# Patient Record
Sex: Male | Born: 1945
Health system: Southern US, Community
[De-identification: ages and names within clinical notes are randomized; demographics above are authoritative.]

## PROBLEM LIST (undated history)

## (undated) DIAGNOSIS — I10 Essential (primary) hypertension: Secondary | ICD-10-CM

## (undated) DIAGNOSIS — R058 Adverse effect of angiotensin-converting-enzyme inhibitors, initial encounter: Secondary | ICD-10-CM

## (undated) DIAGNOSIS — Z87442 Personal history of urinary calculi: Secondary | ICD-10-CM

## (undated) DIAGNOSIS — J189 Pneumonia, unspecified organism: Secondary | ICD-10-CM

## (undated) DIAGNOSIS — T464X5A Adverse effect of angiotensin-converting-enzyme inhibitors, initial encounter: Secondary | ICD-10-CM

## (undated) DIAGNOSIS — Z9889 Other specified postprocedural states: Secondary | ICD-10-CM

## (undated) DIAGNOSIS — R112 Nausea with vomiting, unspecified: Secondary | ICD-10-CM

## (undated) DIAGNOSIS — M1990 Unspecified osteoarthritis, unspecified site: Secondary | ICD-10-CM

## (undated) DIAGNOSIS — Z8489 Family history of other specified conditions: Secondary | ICD-10-CM

## (undated) DIAGNOSIS — R519 Headache, unspecified: Secondary | ICD-10-CM

## (undated) DIAGNOSIS — R51 Headache: Secondary | ICD-10-CM

## (undated) DIAGNOSIS — E119 Type 2 diabetes mellitus without complications: Secondary | ICD-10-CM

## (undated) DIAGNOSIS — D649 Anemia, unspecified: Secondary | ICD-10-CM

## (undated) HISTORY — DX: Adverse effect of angiotensin-converting-enzyme inhibitors, initial encounter: R05.8

## (undated) HISTORY — PX: EYE SURGERY: SHX253

## (undated) HISTORY — DX: Adverse effect of angiotensin-converting-enzyme inhibitors, initial encounter: T46.4X5A

---

## 1998-06-30 DIAGNOSIS — Z87442 Personal history of urinary calculi: Secondary | ICD-10-CM

## 1998-06-30 HISTORY — DX: Personal history of urinary calculi: Z87.442

## 2002-06-30 HISTORY — PX: BACK SURGERY: SHX140

## 2007-07-01 HISTORY — PX: CARPAL TUNNEL RELEASE: SHX101

## 2014-09-05 DIAGNOSIS — E119 Type 2 diabetes mellitus without complications: Secondary | ICD-10-CM | POA: Diagnosis not present

## 2014-09-05 DIAGNOSIS — E782 Mixed hyperlipidemia: Secondary | ICD-10-CM | POA: Diagnosis not present

## 2014-11-30 DIAGNOSIS — L821 Other seborrheic keratosis: Secondary | ICD-10-CM | POA: Diagnosis not present

## 2014-11-30 DIAGNOSIS — B079 Viral wart, unspecified: Secondary | ICD-10-CM | POA: Diagnosis not present

## 2014-11-30 DIAGNOSIS — L820 Inflamed seborrheic keratosis: Secondary | ICD-10-CM | POA: Diagnosis not present

## 2014-11-30 DIAGNOSIS — D485 Neoplasm of uncertain behavior of skin: Secondary | ICD-10-CM | POA: Diagnosis not present

## 2014-12-04 DIAGNOSIS — E119 Type 2 diabetes mellitus without complications: Secondary | ICD-10-CM | POA: Diagnosis not present

## 2014-12-04 DIAGNOSIS — I10 Essential (primary) hypertension: Secondary | ICD-10-CM | POA: Diagnosis not present

## 2014-12-04 DIAGNOSIS — E782 Mixed hyperlipidemia: Secondary | ICD-10-CM | POA: Diagnosis not present

## 2015-01-12 DIAGNOSIS — H04129 Dry eye syndrome of unspecified lacrimal gland: Secondary | ICD-10-CM | POA: Diagnosis not present

## 2015-01-12 DIAGNOSIS — E119 Type 2 diabetes mellitus without complications: Secondary | ICD-10-CM | POA: Diagnosis not present

## 2015-01-12 DIAGNOSIS — H2513 Age-related nuclear cataract, bilateral: Secondary | ICD-10-CM | POA: Diagnosis not present

## 2015-02-14 DIAGNOSIS — H2513 Age-related nuclear cataract, bilateral: Secondary | ICD-10-CM | POA: Diagnosis not present

## 2015-02-14 DIAGNOSIS — H04129 Dry eye syndrome of unspecified lacrimal gland: Secondary | ICD-10-CM | POA: Diagnosis not present

## 2015-03-06 DIAGNOSIS — I10 Essential (primary) hypertension: Secondary | ICD-10-CM | POA: Diagnosis not present

## 2015-03-06 DIAGNOSIS — E782 Mixed hyperlipidemia: Secondary | ICD-10-CM | POA: Diagnosis not present

## 2015-03-06 DIAGNOSIS — E119 Type 2 diabetes mellitus without complications: Secondary | ICD-10-CM | POA: Diagnosis not present

## 2015-03-06 DIAGNOSIS — N528 Other male erectile dysfunction: Secondary | ICD-10-CM | POA: Diagnosis not present

## 2015-03-06 DIAGNOSIS — G44009 Cluster headache syndrome, unspecified, not intractable: Secondary | ICD-10-CM | POA: Diagnosis not present

## 2015-10-24 DIAGNOSIS — M170 Bilateral primary osteoarthritis of knee: Secondary | ICD-10-CM | POA: Diagnosis not present

## 2015-10-24 DIAGNOSIS — Z23 Encounter for immunization: Secondary | ICD-10-CM | POA: Diagnosis not present

## 2015-10-24 DIAGNOSIS — L812 Freckles: Secondary | ICD-10-CM | POA: Diagnosis not present

## 2015-10-24 DIAGNOSIS — E782 Mixed hyperlipidemia: Secondary | ICD-10-CM | POA: Diagnosis not present

## 2015-10-24 DIAGNOSIS — E119 Type 2 diabetes mellitus without complications: Secondary | ICD-10-CM | POA: Diagnosis not present

## 2015-10-24 DIAGNOSIS — H04129 Dry eye syndrome of unspecified lacrimal gland: Secondary | ICD-10-CM | POA: Diagnosis not present

## 2016-01-16 DIAGNOSIS — E782 Mixed hyperlipidemia: Secondary | ICD-10-CM | POA: Diagnosis not present

## 2016-01-16 DIAGNOSIS — L812 Freckles: Secondary | ICD-10-CM | POA: Diagnosis not present

## 2016-01-16 DIAGNOSIS — E119 Type 2 diabetes mellitus without complications: Secondary | ICD-10-CM | POA: Diagnosis not present

## 2016-01-16 DIAGNOSIS — H04129 Dry eye syndrome of unspecified lacrimal gland: Secondary | ICD-10-CM | POA: Diagnosis not present

## 2016-01-16 DIAGNOSIS — M170 Bilateral primary osteoarthritis of knee: Secondary | ICD-10-CM | POA: Diagnosis not present

## 2016-01-18 DIAGNOSIS — Z6829 Body mass index (BMI) 29.0-29.9, adult: Secondary | ICD-10-CM | POA: Diagnosis not present

## 2016-01-18 DIAGNOSIS — Z1389 Encounter for screening for other disorder: Secondary | ICD-10-CM | POA: Diagnosis not present

## 2016-01-18 DIAGNOSIS — E782 Mixed hyperlipidemia: Secondary | ICD-10-CM | POA: Diagnosis not present

## 2016-01-18 DIAGNOSIS — N529 Male erectile dysfunction, unspecified: Secondary | ICD-10-CM | POA: Diagnosis not present

## 2016-01-18 DIAGNOSIS — M170 Bilateral primary osteoarthritis of knee: Secondary | ICD-10-CM | POA: Diagnosis not present

## 2016-01-18 DIAGNOSIS — E119 Type 2 diabetes mellitus without complications: Secondary | ICD-10-CM | POA: Diagnosis not present

## 2016-03-06 DIAGNOSIS — R5383 Other fatigue: Secondary | ICD-10-CM | POA: Diagnosis not present

## 2016-03-06 DIAGNOSIS — E119 Type 2 diabetes mellitus without complications: Secondary | ICD-10-CM | POA: Diagnosis not present

## 2016-03-06 DIAGNOSIS — D519 Vitamin B12 deficiency anemia, unspecified: Secondary | ICD-10-CM | POA: Diagnosis not present

## 2016-03-06 DIAGNOSIS — Z6830 Body mass index (BMI) 30.0-30.9, adult: Secondary | ICD-10-CM | POA: Diagnosis not present

## 2016-03-06 DIAGNOSIS — M791 Myalgia: Secondary | ICD-10-CM | POA: Diagnosis not present

## 2016-03-06 DIAGNOSIS — S30861A Insect bite (nonvenomous) of abdominal wall, initial encounter: Secondary | ICD-10-CM | POA: Diagnosis not present

## 2016-06-30 HISTORY — PX: HERNIA REPAIR: SHX51

## 2016-07-21 DIAGNOSIS — E782 Mixed hyperlipidemia: Secondary | ICD-10-CM | POA: Diagnosis not present

## 2016-07-21 DIAGNOSIS — M170 Bilateral primary osteoarthritis of knee: Secondary | ICD-10-CM | POA: Diagnosis not present

## 2016-07-21 DIAGNOSIS — R5383 Other fatigue: Secondary | ICD-10-CM | POA: Diagnosis not present

## 2016-07-21 DIAGNOSIS — N400 Enlarged prostate without lower urinary tract symptoms: Secondary | ICD-10-CM | POA: Diagnosis not present

## 2016-07-21 DIAGNOSIS — E119 Type 2 diabetes mellitus without complications: Secondary | ICD-10-CM | POA: Diagnosis not present

## 2016-07-21 DIAGNOSIS — N529 Male erectile dysfunction, unspecified: Secondary | ICD-10-CM | POA: Diagnosis not present

## 2016-07-24 DIAGNOSIS — M1712 Unilateral primary osteoarthritis, left knee: Secondary | ICD-10-CM | POA: Diagnosis not present

## 2016-07-24 DIAGNOSIS — Z0001 Encounter for general adult medical examination with abnormal findings: Secondary | ICD-10-CM | POA: Diagnosis not present

## 2016-07-24 DIAGNOSIS — N529 Male erectile dysfunction, unspecified: Secondary | ICD-10-CM | POA: Diagnosis not present

## 2016-07-24 DIAGNOSIS — M170 Bilateral primary osteoarthritis of knee: Secondary | ICD-10-CM | POA: Diagnosis not present

## 2016-07-24 DIAGNOSIS — E119 Type 2 diabetes mellitus without complications: Secondary | ICD-10-CM | POA: Diagnosis not present

## 2016-07-24 DIAGNOSIS — E782 Mixed hyperlipidemia: Secondary | ICD-10-CM | POA: Diagnosis not present

## 2016-07-24 DIAGNOSIS — H04129 Dry eye syndrome of unspecified lacrimal gland: Secondary | ICD-10-CM | POA: Diagnosis not present

## 2016-07-24 DIAGNOSIS — M1711 Unilateral primary osteoarthritis, right knee: Secondary | ICD-10-CM | POA: Diagnosis not present

## 2016-10-14 DIAGNOSIS — H2513 Age-related nuclear cataract, bilateral: Secondary | ICD-10-CM | POA: Diagnosis not present

## 2016-10-14 DIAGNOSIS — E119 Type 2 diabetes mellitus without complications: Secondary | ICD-10-CM | POA: Diagnosis not present

## 2016-10-14 DIAGNOSIS — Z9889 Other specified postprocedural states: Secondary | ICD-10-CM | POA: Diagnosis not present

## 2017-01-20 DIAGNOSIS — H04129 Dry eye syndrome of unspecified lacrimal gland: Secondary | ICD-10-CM | POA: Diagnosis not present

## 2017-01-20 DIAGNOSIS — E119 Type 2 diabetes mellitus without complications: Secondary | ICD-10-CM | POA: Diagnosis not present

## 2017-01-20 DIAGNOSIS — E782 Mixed hyperlipidemia: Secondary | ICD-10-CM | POA: Diagnosis not present

## 2017-01-20 DIAGNOSIS — R5383 Other fatigue: Secondary | ICD-10-CM | POA: Diagnosis not present

## 2017-01-22 DIAGNOSIS — M1712 Unilateral primary osteoarthritis, left knee: Secondary | ICD-10-CM | POA: Diagnosis not present

## 2017-01-22 DIAGNOSIS — H8110 Benign paroxysmal vertigo, unspecified ear: Secondary | ICD-10-CM | POA: Diagnosis not present

## 2017-01-22 DIAGNOSIS — K429 Umbilical hernia without obstruction or gangrene: Secondary | ICD-10-CM | POA: Diagnosis not present

## 2017-01-22 DIAGNOSIS — R0609 Other forms of dyspnea: Secondary | ICD-10-CM | POA: Diagnosis not present

## 2017-01-22 DIAGNOSIS — E119 Type 2 diabetes mellitus without complications: Secondary | ICD-10-CM | POA: Diagnosis not present

## 2017-01-22 DIAGNOSIS — Z77098 Contact with and (suspected) exposure to other hazardous, chiefly nonmedicinal, chemicals: Secondary | ICD-10-CM | POA: Diagnosis not present

## 2017-01-22 DIAGNOSIS — R5383 Other fatigue: Secondary | ICD-10-CM | POA: Diagnosis not present

## 2017-01-22 DIAGNOSIS — E782 Mixed hyperlipidemia: Secondary | ICD-10-CM | POA: Diagnosis not present

## 2017-01-30 DIAGNOSIS — R931 Abnormal findings on diagnostic imaging of heart and coronary circulation: Secondary | ICD-10-CM | POA: Diagnosis not present

## 2017-01-30 DIAGNOSIS — R0600 Dyspnea, unspecified: Secondary | ICD-10-CM | POA: Diagnosis not present

## 2017-01-30 DIAGNOSIS — R0609 Other forms of dyspnea: Secondary | ICD-10-CM | POA: Diagnosis not present

## 2017-02-02 DIAGNOSIS — L820 Inflamed seborrheic keratosis: Secondary | ICD-10-CM | POA: Diagnosis not present

## 2017-02-02 DIAGNOSIS — D239 Other benign neoplasm of skin, unspecified: Secondary | ICD-10-CM | POA: Diagnosis not present

## 2017-02-02 DIAGNOSIS — L821 Other seborrheic keratosis: Secondary | ICD-10-CM | POA: Diagnosis not present

## 2017-02-06 DIAGNOSIS — Z809 Family history of malignant neoplasm, unspecified: Secondary | ICD-10-CM | POA: Diagnosis not present

## 2017-02-06 DIAGNOSIS — M1712 Unilateral primary osteoarthritis, left knee: Secondary | ICD-10-CM | POA: Diagnosis not present

## 2017-02-06 DIAGNOSIS — K420 Umbilical hernia with obstruction, without gangrene: Secondary | ICD-10-CM | POA: Diagnosis not present

## 2017-02-06 DIAGNOSIS — Z7984 Long term (current) use of oral hypoglycemic drugs: Secondary | ICD-10-CM | POA: Diagnosis not present

## 2017-02-06 DIAGNOSIS — Z79899 Other long term (current) drug therapy: Secondary | ICD-10-CM | POA: Diagnosis not present

## 2017-02-06 DIAGNOSIS — E119 Type 2 diabetes mellitus without complications: Secondary | ICD-10-CM | POA: Diagnosis not present

## 2017-02-06 DIAGNOSIS — E785 Hyperlipidemia, unspecified: Secondary | ICD-10-CM | POA: Diagnosis not present

## 2017-02-06 DIAGNOSIS — I10 Essential (primary) hypertension: Secondary | ICD-10-CM | POA: Diagnosis not present

## 2017-02-10 DIAGNOSIS — Z79899 Other long term (current) drug therapy: Secondary | ICD-10-CM | POA: Diagnosis not present

## 2017-02-10 DIAGNOSIS — K429 Umbilical hernia without obstruction or gangrene: Secondary | ICD-10-CM | POA: Diagnosis not present

## 2017-02-10 DIAGNOSIS — E119 Type 2 diabetes mellitus without complications: Secondary | ICD-10-CM | POA: Diagnosis not present

## 2017-02-10 DIAGNOSIS — Z7984 Long term (current) use of oral hypoglycemic drugs: Secondary | ICD-10-CM | POA: Diagnosis not present

## 2017-02-10 DIAGNOSIS — K420 Umbilical hernia with obstruction, without gangrene: Secondary | ICD-10-CM | POA: Diagnosis not present

## 2017-02-10 DIAGNOSIS — I10 Essential (primary) hypertension: Secondary | ICD-10-CM | POA: Diagnosis not present

## 2017-02-10 DIAGNOSIS — E785 Hyperlipidemia, unspecified: Secondary | ICD-10-CM | POA: Diagnosis not present

## 2017-02-11 DIAGNOSIS — R05 Cough: Secondary | ICD-10-CM | POA: Diagnosis not present

## 2017-02-11 DIAGNOSIS — E785 Hyperlipidemia, unspecified: Secondary | ICD-10-CM | POA: Diagnosis not present

## 2017-02-11 DIAGNOSIS — I10 Essential (primary) hypertension: Secondary | ICD-10-CM | POA: Diagnosis not present

## 2017-02-11 DIAGNOSIS — Z79899 Other long term (current) drug therapy: Secondary | ICD-10-CM | POA: Diagnosis not present

## 2017-02-11 DIAGNOSIS — R0981 Nasal congestion: Secondary | ICD-10-CM | POA: Diagnosis not present

## 2017-02-11 DIAGNOSIS — R0902 Hypoxemia: Secondary | ICD-10-CM | POA: Diagnosis not present

## 2017-02-11 DIAGNOSIS — E119 Type 2 diabetes mellitus without complications: Secondary | ICD-10-CM | POA: Diagnosis not present

## 2017-02-11 DIAGNOSIS — Z9889 Other specified postprocedural states: Secondary | ICD-10-CM | POA: Diagnosis not present

## 2017-02-11 DIAGNOSIS — Z7984 Long term (current) use of oral hypoglycemic drugs: Secondary | ICD-10-CM | POA: Diagnosis not present

## 2017-02-11 DIAGNOSIS — R0602 Shortness of breath: Secondary | ICD-10-CM | POA: Diagnosis not present

## 2017-02-11 DIAGNOSIS — M1990 Unspecified osteoarthritis, unspecified site: Secondary | ICD-10-CM | POA: Diagnosis not present

## 2017-02-13 DIAGNOSIS — R0602 Shortness of breath: Secondary | ICD-10-CM | POA: Diagnosis not present

## 2017-02-19 DIAGNOSIS — J449 Chronic obstructive pulmonary disease, unspecified: Secondary | ICD-10-CM | POA: Diagnosis not present

## 2017-02-19 DIAGNOSIS — Z77090 Contact with and (suspected) exposure to asbestos: Secondary | ICD-10-CM | POA: Diagnosis not present

## 2017-02-19 DIAGNOSIS — Z6829 Body mass index (BMI) 29.0-29.9, adult: Secondary | ICD-10-CM | POA: Diagnosis not present

## 2017-02-19 DIAGNOSIS — R0902 Hypoxemia: Secondary | ICD-10-CM | POA: Diagnosis not present

## 2017-02-19 DIAGNOSIS — R0609 Other forms of dyspnea: Secondary | ICD-10-CM | POA: Diagnosis not present

## 2017-02-19 DIAGNOSIS — E782 Mixed hyperlipidemia: Secondary | ICD-10-CM | POA: Diagnosis not present

## 2017-02-19 DIAGNOSIS — E119 Type 2 diabetes mellitus without complications: Secondary | ICD-10-CM | POA: Diagnosis not present

## 2017-02-19 DIAGNOSIS — R5383 Other fatigue: Secondary | ICD-10-CM | POA: Diagnosis not present

## 2017-02-23 DIAGNOSIS — K760 Fatty (change of) liver, not elsewhere classified: Secondary | ICD-10-CM | POA: Diagnosis not present

## 2017-02-23 DIAGNOSIS — I2510 Atherosclerotic heart disease of native coronary artery without angina pectoris: Secondary | ICD-10-CM | POA: Diagnosis not present

## 2017-02-23 DIAGNOSIS — R0609 Other forms of dyspnea: Secondary | ICD-10-CM | POA: Diagnosis not present

## 2017-02-23 DIAGNOSIS — R0602 Shortness of breath: Secondary | ICD-10-CM | POA: Diagnosis not present

## 2017-02-23 DIAGNOSIS — J449 Chronic obstructive pulmonary disease, unspecified: Secondary | ICD-10-CM | POA: Diagnosis not present

## 2017-02-23 DIAGNOSIS — R918 Other nonspecific abnormal finding of lung field: Secondary | ICD-10-CM | POA: Diagnosis not present

## 2017-05-20 DIAGNOSIS — E119 Type 2 diabetes mellitus without complications: Secondary | ICD-10-CM | POA: Diagnosis not present

## 2017-05-20 DIAGNOSIS — E782 Mixed hyperlipidemia: Secondary | ICD-10-CM | POA: Diagnosis not present

## 2017-05-20 DIAGNOSIS — R0902 Hypoxemia: Secondary | ICD-10-CM | POA: Diagnosis not present

## 2017-05-20 DIAGNOSIS — J449 Chronic obstructive pulmonary disease, unspecified: Secondary | ICD-10-CM | POA: Diagnosis not present

## 2017-05-20 DIAGNOSIS — M1712 Unilateral primary osteoarthritis, left knee: Secondary | ICD-10-CM | POA: Diagnosis not present

## 2017-05-20 DIAGNOSIS — R5383 Other fatigue: Secondary | ICD-10-CM | POA: Diagnosis not present

## 2017-05-20 DIAGNOSIS — M1711 Unilateral primary osteoarthritis, right knee: Secondary | ICD-10-CM | POA: Diagnosis not present

## 2017-05-27 DIAGNOSIS — Z77090 Contact with and (suspected) exposure to asbestos: Secondary | ICD-10-CM | POA: Diagnosis not present

## 2017-05-27 DIAGNOSIS — I2510 Atherosclerotic heart disease of native coronary artery without angina pectoris: Secondary | ICD-10-CM | POA: Diagnosis not present

## 2017-05-27 DIAGNOSIS — E782 Mixed hyperlipidemia: Secondary | ICD-10-CM | POA: Diagnosis not present

## 2017-05-27 DIAGNOSIS — J449 Chronic obstructive pulmonary disease, unspecified: Secondary | ICD-10-CM | POA: Diagnosis not present

## 2017-05-27 DIAGNOSIS — R0902 Hypoxemia: Secondary | ICD-10-CM | POA: Diagnosis not present

## 2017-05-27 DIAGNOSIS — Z6829 Body mass index (BMI) 29.0-29.9, adult: Secondary | ICD-10-CM | POA: Diagnosis not present

## 2017-05-27 DIAGNOSIS — M170 Bilateral primary osteoarthritis of knee: Secondary | ICD-10-CM | POA: Diagnosis not present

## 2017-05-27 DIAGNOSIS — E119 Type 2 diabetes mellitus without complications: Secondary | ICD-10-CM | POA: Diagnosis not present

## 2017-07-06 DIAGNOSIS — M545 Low back pain: Secondary | ICD-10-CM | POA: Diagnosis not present

## 2017-07-06 DIAGNOSIS — Z6829 Body mass index (BMI) 29.0-29.9, adult: Secondary | ICD-10-CM | POA: Diagnosis not present

## 2017-07-06 DIAGNOSIS — M791 Myalgia, unspecified site: Secondary | ICD-10-CM | POA: Diagnosis not present

## 2017-07-28 DIAGNOSIS — M545 Low back pain: Secondary | ICD-10-CM | POA: Diagnosis not present

## 2017-07-28 DIAGNOSIS — Z6829 Body mass index (BMI) 29.0-29.9, adult: Secondary | ICD-10-CM | POA: Diagnosis not present

## 2017-07-28 DIAGNOSIS — N401 Enlarged prostate with lower urinary tract symptoms: Secondary | ICD-10-CM | POA: Diagnosis not present

## 2017-07-28 DIAGNOSIS — R319 Hematuria, unspecified: Secondary | ICD-10-CM | POA: Diagnosis not present

## 2017-08-04 DIAGNOSIS — K760 Fatty (change of) liver, not elsewhere classified: Secondary | ICD-10-CM | POA: Diagnosis not present

## 2017-08-04 DIAGNOSIS — R319 Hematuria, unspecified: Secondary | ICD-10-CM | POA: Diagnosis not present

## 2017-11-10 DIAGNOSIS — M25562 Pain in left knee: Secondary | ICD-10-CM | POA: Diagnosis not present

## 2017-11-13 DIAGNOSIS — K760 Fatty (change of) liver, not elsewhere classified: Secondary | ICD-10-CM | POA: Diagnosis not present

## 2017-11-13 DIAGNOSIS — I2510 Atherosclerotic heart disease of native coronary artery without angina pectoris: Secondary | ICD-10-CM | POA: Diagnosis not present

## 2017-11-13 DIAGNOSIS — Z202 Contact with and (suspected) exposure to infections with a predominantly sexual mode of transmission: Secondary | ICD-10-CM | POA: Diagnosis not present

## 2017-11-13 DIAGNOSIS — J449 Chronic obstructive pulmonary disease, unspecified: Secondary | ICD-10-CM | POA: Diagnosis not present

## 2017-11-13 DIAGNOSIS — E782 Mixed hyperlipidemia: Secondary | ICD-10-CM | POA: Diagnosis not present

## 2017-11-13 DIAGNOSIS — Z205 Contact with and (suspected) exposure to viral hepatitis: Secondary | ICD-10-CM | POA: Diagnosis not present

## 2017-11-13 DIAGNOSIS — E119 Type 2 diabetes mellitus without complications: Secondary | ICD-10-CM | POA: Diagnosis not present

## 2017-11-13 DIAGNOSIS — R945 Abnormal results of liver function studies: Secondary | ICD-10-CM | POA: Diagnosis not present

## 2017-11-16 DIAGNOSIS — R0902 Hypoxemia: Secondary | ICD-10-CM | POA: Diagnosis not present

## 2017-11-16 DIAGNOSIS — M1712 Unilateral primary osteoarthritis, left knee: Secondary | ICD-10-CM | POA: Diagnosis not present

## 2017-11-16 DIAGNOSIS — Z23 Encounter for immunization: Secondary | ICD-10-CM | POA: Diagnosis not present

## 2017-11-16 DIAGNOSIS — Z0001 Encounter for general adult medical examination with abnormal findings: Secondary | ICD-10-CM | POA: Diagnosis not present

## 2017-11-16 DIAGNOSIS — K760 Fatty (change of) liver, not elsewhere classified: Secondary | ICD-10-CM | POA: Diagnosis not present

## 2017-11-16 DIAGNOSIS — E782 Mixed hyperlipidemia: Secondary | ICD-10-CM | POA: Diagnosis not present

## 2017-11-16 DIAGNOSIS — Z6829 Body mass index (BMI) 29.0-29.9, adult: Secondary | ICD-10-CM | POA: Diagnosis not present

## 2017-11-16 DIAGNOSIS — E119 Type 2 diabetes mellitus without complications: Secondary | ICD-10-CM | POA: Diagnosis not present

## 2017-11-19 DIAGNOSIS — R0602 Shortness of breath: Secondary | ICD-10-CM | POA: Diagnosis not present

## 2017-11-19 DIAGNOSIS — R42 Dizziness and giddiness: Secondary | ICD-10-CM | POA: Diagnosis not present

## 2017-11-19 DIAGNOSIS — I517 Cardiomegaly: Secondary | ICD-10-CM | POA: Diagnosis not present

## 2017-12-21 DIAGNOSIS — R197 Diarrhea, unspecified: Secondary | ICD-10-CM | POA: Diagnosis not present

## 2017-12-21 DIAGNOSIS — Z6828 Body mass index (BMI) 28.0-28.9, adult: Secondary | ICD-10-CM | POA: Diagnosis not present

## 2017-12-21 DIAGNOSIS — R110 Nausea: Secondary | ICD-10-CM | POA: Diagnosis not present

## 2018-01-13 DIAGNOSIS — J019 Acute sinusitis, unspecified: Secondary | ICD-10-CM | POA: Diagnosis not present

## 2018-01-13 DIAGNOSIS — Z681 Body mass index (BMI) 19 or less, adult: Secondary | ICD-10-CM | POA: Diagnosis not present

## 2018-01-13 DIAGNOSIS — J209 Acute bronchitis, unspecified: Secondary | ICD-10-CM | POA: Diagnosis not present

## 2018-01-26 DIAGNOSIS — Z9889 Other specified postprocedural states: Secondary | ICD-10-CM | POA: Diagnosis not present

## 2018-01-26 DIAGNOSIS — H2513 Age-related nuclear cataract, bilateral: Secondary | ICD-10-CM | POA: Diagnosis not present

## 2018-01-27 DIAGNOSIS — Z6828 Body mass index (BMI) 28.0-28.9, adult: Secondary | ICD-10-CM | POA: Diagnosis not present

## 2018-01-27 DIAGNOSIS — I2510 Atherosclerotic heart disease of native coronary artery without angina pectoris: Secondary | ICD-10-CM | POA: Diagnosis not present

## 2018-01-27 DIAGNOSIS — R05 Cough: Secondary | ICD-10-CM | POA: Diagnosis not present

## 2018-01-27 DIAGNOSIS — E119 Type 2 diabetes mellitus without complications: Secondary | ICD-10-CM | POA: Diagnosis not present

## 2018-01-27 DIAGNOSIS — E782 Mixed hyperlipidemia: Secondary | ICD-10-CM | POA: Diagnosis not present

## 2018-01-27 DIAGNOSIS — J029 Acute pharyngitis, unspecified: Secondary | ICD-10-CM | POA: Diagnosis not present

## 2018-02-16 DIAGNOSIS — R0609 Other forms of dyspnea: Secondary | ICD-10-CM | POA: Diagnosis not present

## 2018-02-16 DIAGNOSIS — E782 Mixed hyperlipidemia: Secondary | ICD-10-CM | POA: Diagnosis not present

## 2018-02-16 DIAGNOSIS — R05 Cough: Secondary | ICD-10-CM | POA: Diagnosis not present

## 2018-02-16 DIAGNOSIS — Z77090 Contact with and (suspected) exposure to asbestos: Secondary | ICD-10-CM | POA: Diagnosis not present

## 2018-02-16 DIAGNOSIS — K760 Fatty (change of) liver, not elsewhere classified: Secondary | ICD-10-CM | POA: Diagnosis not present

## 2018-02-16 DIAGNOSIS — E119 Type 2 diabetes mellitus without complications: Secondary | ICD-10-CM | POA: Diagnosis not present

## 2018-02-16 DIAGNOSIS — I2510 Atherosclerotic heart disease of native coronary artery without angina pectoris: Secondary | ICD-10-CM | POA: Diagnosis not present

## 2018-02-16 DIAGNOSIS — Z6829 Body mass index (BMI) 29.0-29.9, adult: Secondary | ICD-10-CM | POA: Diagnosis not present

## 2018-03-10 DIAGNOSIS — M25562 Pain in left knee: Secondary | ICD-10-CM | POA: Diagnosis not present

## 2018-03-23 DIAGNOSIS — M1712 Unilateral primary osteoarthritis, left knee: Secondary | ICD-10-CM | POA: Diagnosis not present

## 2018-03-23 DIAGNOSIS — M25562 Pain in left knee: Secondary | ICD-10-CM | POA: Diagnosis not present

## 2018-04-02 DIAGNOSIS — E119 Type 2 diabetes mellitus without complications: Secondary | ICD-10-CM | POA: Diagnosis not present

## 2018-04-02 DIAGNOSIS — R319 Hematuria, unspecified: Secondary | ICD-10-CM | POA: Diagnosis not present

## 2018-04-02 DIAGNOSIS — J449 Chronic obstructive pulmonary disease, unspecified: Secondary | ICD-10-CM | POA: Diagnosis not present

## 2018-04-02 DIAGNOSIS — I2510 Atherosclerotic heart disease of native coronary artery without angina pectoris: Secondary | ICD-10-CM | POA: Diagnosis not present

## 2018-04-02 DIAGNOSIS — I517 Cardiomegaly: Secondary | ICD-10-CM | POA: Diagnosis not present

## 2018-04-02 DIAGNOSIS — E782 Mixed hyperlipidemia: Secondary | ICD-10-CM | POA: Diagnosis not present

## 2018-04-02 DIAGNOSIS — Z01810 Encounter for preprocedural cardiovascular examination: Secondary | ICD-10-CM | POA: Diagnosis not present

## 2018-04-02 DIAGNOSIS — K760 Fatty (change of) liver, not elsewhere classified: Secondary | ICD-10-CM | POA: Diagnosis not present

## 2018-04-02 DIAGNOSIS — Z6828 Body mass index (BMI) 28.0-28.9, adult: Secondary | ICD-10-CM | POA: Diagnosis not present

## 2018-04-12 DIAGNOSIS — M25562 Pain in left knee: Secondary | ICD-10-CM | POA: Diagnosis not present

## 2018-04-23 NOTE — Pre-Procedure Instructions (Signed)
Jerry Dodson  04/23/2018      [facility] [place], [place], [place] [address] [place] [address] Phone: [phone] Fax: [phone]    Your procedure is scheduled on 05/07/2018.  Report to [facility] Admitting at 11:00 A.M.  Call this number if you have problems the morning of surgery:  [phone]   Remember:  Do not eat or drink after midnight.    Take these medicines the morning of surgery with A SIP OF WATER: NONE  7 days prior to surgery STOP taking any Aspirin(unless otherwise instructed by your surgeon), Aleve, Naproxen, Ibuprofen, Motrin, Advil, Goody's, BC's, all herbal medications, fish oil, and all vitamins   WHAT DO I DO ABOUT MY DIABETES MEDICATION?  [name] Kitchen Do not take oral diabetes medicines (Glucophage (Metformin)) the morning of surgery.   How to Manage Your Diabetes Before and After Surgery  Why is it important to control my blood sugar before and after surgery? . Improving blood sugar levels before and after surgery helps healing and can limit problems. . A way of improving blood sugar control is eating a healthy diet by: o  Eating less sugar and carbohydrates o  Increasing activity/exercise o  Talking with your doctor about reaching your blood sugar goals . High blood sugars (greater than 180 mg/dL) can raise your risk of infections and slow your recovery, so you will need to focus on controlling your diabetes during the weeks before surgery. . Make sure that the doctor who takes care of your diabetes knows about your planned surgery including the date and location.  How do I manage my blood sugar before surgery? . Check your blood sugar at least 4 times a day, starting 2 days before surgery, to make sure that the level is not too high or low. o Check your blood sugar the morning of your surgery when you wake up and every 2 hours until you get to the Short Stay unit. . If your blood sugar is less than 70  mg/dL, you will need to treat for low blood sugar: o Do not take insulin. o Treat a low blood sugar (less than 70 mg/dL) with  cup of clear juice (cranberry or apple), 4 glucose tablets, OR glucose gel. o Recheck blood sugar in 15 minutes after treatment (to make sure it is greater than 70 mg/dL). If your blood sugar is not greater than 70 mg/dL on recheck, call [phone] for further instructions. . Report your blood sugar to the short stay nurse when you get to Short Stay.  . If you are admitted to the hospital after surgery: o Your blood sugar will be checked by the staff and you will probably be given insulin after surgery (instead of oral diabetes medicines) to make sure you have good blood sugar levels. o The goal for blood sugar control after surgery is 80-180 mg/dL.      Do not wear jewelry  Do not wear lotions, powders, or colognes, or deodorant.  Men may shave face and neck.  Do not bring valuables to the hospital.  [facility] is not responsible for any belongings or valuables.  Contacts, eyeglasses, hearing aids, dentures or bridgework may not be worn into surgery.  Leave your suitcase in the car.  After surgery it may be brought to your room.  For patients admitted to the hospital, discharge time will be determined by your treatment team.  Patients discharged  the day of surgery will not be allowed to drive home.   Name and phone number of your driver:    Special instructions:   [name] Preparing For Surgery  Before surgery, you can play an important role. Because skin is not sterile, your skin needs to be as free of germs as possible. You can reduce the number of germs on your skin by washing with CHG (chlorahexidine gluconate) Soap before surgery.  CHG is an antiseptic cleaner which kills germs and bonds with the skin to continue killing germs even after washing.    Oral Hygiene is also important to reduce your risk of infection.  Remember - BRUSH YOUR TEETH THE  MORNING OF SURGERY WITH YOUR REGULAR TOOTHPASTE  Please do not use if you have an allergy to CHG or antibacterial soaps. If your skin becomes reddened/irritated stop using the CHG.  Do not shave (including legs and underarms) for at least 48 hours prior to first CHG shower. It is OK to shave your face.  Please follow these instructions carefully.   1. Shower the NIGHT BEFORE SURGERY and the MORNING OF SURGERY with CHG.   2. If you chose to wash your hair, wash your hair first as usual with your normal shampoo.  3. After you shampoo, rinse your hair and body thoroughly to remove the shampoo.  4. Use CHG as you would any other liquid soap. You can apply CHG directly to the skin and wash gently with a scrungie or a clean washcloth.   5. Apply the CHG Soap to your body ONLY FROM THE NECK DOWN.  Do not use on open wounds or open sores. Avoid contact with your eyes, ears, mouth and genitals (private parts). Wash Face and genitals (private parts)  with your normal soap.  6. Wash thoroughly, paying special attention to the area where your surgery will be performed.  7. Thoroughly rinse your body with warm water from the neck down.  8. DO NOT shower/wash with your normal soap after using and rinsing off the CHG Soap.  9. Pat yourself dry with a CLEAN TOWEL.  10. Wear CLEAN PAJAMAS to bed the night before surgery, wear comfortable clothes the morning of surgery  11. Place CLEAN SHEETS on your bed the night of your first shower and DO NOT SLEEP WITH PETS.    Day of Surgery: Shower as stated above. Do not apply any deodorants/lotions.  Please wear clean clothes to the hospital/surgery center.   Remember to brush your teeth WITH YOUR REGULAR TOOTHPASTE.    Please read over the following fact sheets that you were given.

## 2018-04-26 ENCOUNTER — Encounter (HOSPITAL_COMMUNITY): Payer: Self-pay

## 2018-04-26 ENCOUNTER — Encounter (HOSPITAL_COMMUNITY)
Admission: RE | Admit: 2018-04-26 | Discharge: 2018-04-26 | Disposition: A | Discharge status: Home / Self Care | Payer: Medicare Other | Source: Ambulatory Visit | Attending: Orthopedic Surgery | Admitting: Orthopedic Surgery

## 2018-04-26 ENCOUNTER — Other Ambulatory Visit: Payer: Self-pay

## 2018-04-26 DIAGNOSIS — I10 Essential (primary) hypertension: Secondary | ICD-10-CM | POA: Diagnosis not present

## 2018-04-26 DIAGNOSIS — Z01818 Encounter for other preprocedural examination: Secondary | ICD-10-CM | POA: Insufficient documentation

## 2018-04-26 DIAGNOSIS — M1712 Unilateral primary osteoarthritis, left knee: Secondary | ICD-10-CM | POA: Diagnosis not present

## 2018-04-26 DIAGNOSIS — Z7984 Long term (current) use of oral hypoglycemic drugs: Secondary | ICD-10-CM | POA: Diagnosis not present

## 2018-04-26 DIAGNOSIS — E119 Type 2 diabetes mellitus without complications: Secondary | ICD-10-CM | POA: Insufficient documentation

## 2018-04-26 DIAGNOSIS — Z79899 Other long term (current) drug therapy: Secondary | ICD-10-CM | POA: Insufficient documentation

## 2018-04-26 HISTORY — DX: Other specified postprocedural states: R11.2

## 2018-04-26 HISTORY — DX: Other specified postprocedural states: Z98.890

## 2018-04-26 HISTORY — DX: Headache: R51

## 2018-04-26 HISTORY — DX: Headache, unspecified: R51.9

## 2018-04-26 HISTORY — DX: Family history of other specified conditions: Z84.89

## 2018-04-26 HISTORY — DX: Type 2 diabetes mellitus without complications: E11.9

## 2018-04-26 HISTORY — DX: Pneumonia, unspecified organism: J18.9

## 2018-04-26 HISTORY — DX: Unspecified osteoarthritis, unspecified site: M19.90

## 2018-04-26 HISTORY — DX: Anemia, unspecified: D64.9

## 2018-04-26 HISTORY — DX: Personal history of urinary calculi: Z87.442

## 2018-04-26 HISTORY — DX: Essential (primary) hypertension: I10

## 2018-04-26 LAB — GLUCOSE, CAPILLARY: Glucose-Capillary: 84 mg/dL (ref 70–99)

## 2018-04-26 LAB — CBC
HCT: 46.4 % (ref 39.0–52.0)
Hemoglobin: 14.9 g/dL (ref 13.0–17.0)
MCH: 29 pg (ref 26.0–34.0)
MCHC: 32.1 g/dL (ref 30.0–36.0)
MCV: 90.4 fL (ref 80.0–100.0)
NRBC: 0 % (ref 0.0–0.2)
PLATELETS: 241 10*3/uL (ref 150–400)
RBC: 5.13 MIL/uL (ref 4.22–5.81)
RDW: 12.4 % (ref 11.5–15.5)
WBC: 7.3 10*3/uL (ref 4.0–10.5)

## 2018-04-26 LAB — SURGICAL PCR SCREEN
MRSA, PCR: NEGATIVE
Staphylococcus aureus: NEGATIVE

## 2018-04-26 LAB — BASIC METABOLIC PANEL
ANION GAP: 10 (ref 5–15)
BUN: 15 mg/dL (ref 8–23)
CHLORIDE: 108 mmol/L (ref 98–111)
CO2: 23 mmol/L (ref 22–32)
Calcium: 9.1 mg/dL (ref 8.9–10.3)
Creatinine, Ser: 1.06 mg/dL (ref 0.61–1.24)
GFR calc non Af Amer: >60 mL/min (ref >60)
Glucose, Bld: 102 mg/dL — ABNORMAL HIGH (ref 70–99)
POTASSIUM: 3.8 mmol/L (ref 3.5–5.1)
SODIUM: 141 mmol/L (ref 135–145)

## 2018-04-26 LAB — HEMOGLOBIN A1C
Hgb A1c MFr Bld: 6.3 % — ABNORMAL HIGH (ref 4.8–5.6)
MEAN PLASMA GLUCOSE: 134.11 mg/dL

## 2018-04-26 NOTE — Progress Notes (Signed)
PCP - Dr. Quintella Baton Cardiologist - denies  Chest x-ray - N/A EKG - patient states he had EKG last month with PCP. Records requested [facility] Test - [facility] at [facility] per patient. Records requested ECHO - denies Cardiac Cath - denies  Sleep Study - patient denies having had sleep study  Patient does not check CBG's at home.   Aspirin Instructions: patient instructed to stop all ASA/NSAID's 7 days prior to surgery.   Anesthesia review: yes- follow up on patients NPO status due to DM and late surgery start time.   Patient denies shortness of breath, fever, cough and chest pain at PAT appointment   Patient verbalized understanding of instructions that were given to them at the PAT appointment. Patient was also instructed that they will need to review over the PAT instructions again at home before surgery.

## 2018-04-27 NOTE — Progress Notes (Addendum)
Anesthesia Chart Review:  Case:  [id number] Date/Time:  05/07/18 1245   Procedure:  LEFT TOTAL KNEE ARTHROPLASTY (Left Knee)   Anesthesia type:  Spinal   Pre-op diagnosis:  Left knee osteoarthritis   Location:  [facility] OR ROOM 05 / [place] OR   Surgeon:  Netta Cedars, MD      DISCUSSION: Patient is a 72 year old male scheduled for the above procedure.  History includes never smoker, post-operative N/V, HTN, DM2.   His PCP Dr. Pleas Koch cleared him at moderate risk for this surgery. Stress and echo within the past two years (see below). If no acute changes then I anticipate that he can proceed as planned.  He is concerned about being NPO after MN due to DM history. CBG 84 at PAT. A1c 6.3. Discussed patient's NPO concerns with anesthesiologist Tamela Gammon, MD. Could consider having patient arrive early for glucose monitoring and IVF if needed. He could also have black coffee and plain toast up to 6:00 AM (would be NPO > 6 hours for 1:00 PM surgery). Would want to limit juice intake to avoid hyperglycemia. Dr. Veverly Fells could also consider moving patient's surgery time up. I have potential NPO options with Margarita Grizzle and have asked her to review with Dr. Veverly Fells. Patient will need to be updated once surgeon input received. (UPDATE 04/28/18 10:35 AM: Margarita Grizzle reviewed with Dr. Veverly Fells. He plans to move patient to a first case. She let Mr. Gosse know about the time change. I notified patient to arrive at 5:30 AM on the morning of surgery and confirmed NPO status after midnight with him.)     VS: BP 116/69   Pulse 84   Temp 36.7 C   Resp 20   Ht 6' (1.829 m)   Wt 96.3 kg   SpO2 95%   BMI 28.79 kg/m   PROVIDERS: [place], Virgina Evener, MD is PCP ([facility])   LABS: Labs reviewed: Acceptable for surgery. (all labs ordered are listed, but only abnormal results are displayed)  Labs Reviewed  BASIC METABOLIC PANEL - Abnormal; Notable for the following components:      Result Value   Glucose, Bld 102  (*)    All other components within normal limits  HEMOGLOBIN A1C - Abnormal; Notable for the following components:   Hgb A1c MFr Bld 6.3 (*)    All other components within normal limits  SURGICAL PCR SCREEN  GLUCOSE, CAPILLARY  CBC    CXR 01/27/18 ([facility]):  Findings: Heart size and mediastinal contours are within normal limits.  Both lungs are clear.  Hyperinflation of the lungs is noted.  No pneumothorax or pleural effusion is noted.  The visualized skeletal structures are unremarkable. Impression: No active cardiopulmonary disease. Hypoinflation of the lungs.   EKG: 04/02/18 ([facility]): SR, non-specific T wave abnormality.    CV: Echo 11/19/17 (ordered by PCP, [facility]): Conclusions: 1.  Mild concentric left ventricular hypertrophy. 2.  Left ventricular systolic function is at the lower limits of normal.  Estimated EF is 50 to 55%. 3.  The right ventricular global systolic function is normal.  Nuclear stress test 01/30/17 ([facility]): Impression: 1.  No reversible ischemia or infarction. 2.  Mild lateral wall hypokinesis. 3.  Left ventricular ejection fraction 54%. 4.  Noninvasive risk stratification: Low.   Past Medical History:  Diagnosis Date  . Anemia    "was told blood didn't have enough iron in 1971"  . Arthritis   . Diabetes mellitus without complication ([place])   .  Family history of adverse reaction to anesthesia    Patient states sister was "given too much anesthesia and now has nerve damage"  . Headache   . History of kidney stones 2000  . Hypertension   . Pneumonia    x 2 at age 48 and in 54  . PONV (postoperative nausea and vomiting)     Past Surgical History:  Procedure Laterality Date  . BACK SURGERY  2004   x2  . CARPAL TUNNEL RELEASE Bilateral 2009  . EYE SURGERY  20+ years ago   Lasik  . HERNIA REPAIR  2018    MEDICATIONS: . atorvastatin (LIPITOR) 10 MG tablet  . lisinopril (PRINIVIL,ZESTRIL) 2.5 MG tablet  .  metFORMIN (GLUCOPHAGE) 500 MG tablet   No current facility-administered medications for this encounter.     George Hugh [facility] Short Stay Center/Anesthesiology Phone [phone] 04/27/2018 6:40 PM

## 2018-04-27 NOTE — H&P (Signed)
Patient's anticipated LOS is less than 2 midnights, meeting these requirements: - Younger than 33 - Lives within 1 hour of care - Has a competent adult at home to recover with post-op recover - NO history of  - Chronic pain requiring opiods  - Diabetes  - Coronary Artery Disease  - Heart failure  - Heart attack  - Stroke  - DVT/VTE  - Cardiac arrhythmia  - Respiratory Failure/COPD  - Renal failure  - Anemia  - Advanced Liver disease       Jerry Dodson is an 72 y.o. male.    Chief Complaint: left knee pain  HPI: Pt is a 72 y.o. male complaining of left knee pain for multiple years. Pain had continually increased since the beginning. X-rays in the clinic show end-stage arthritic changes of the left knee. Pt has tried various conservative treatments which have failed to alleviate their symptoms, including injections and therapy. Various options are discussed with the patient. Risks, benefits and expectations were discussed with the patient. Patient understand the risks, benefits and expectations and wishes to proceed with surgery.   PCP:  Curlene Labrum, MD  D/C Plans: Home  PMH: Past Medical History:  Diagnosis Date  . Anemia    "was told blood didn't have enough iron in 1971"  . Arthritis   . Diabetes mellitus without complication ([place])   . Family history of adverse reaction to anesthesia    Patient states sister was "given too much anesthesia and now has nerve damage"  . Headache   . History of kidney stones 2000  . Hypertension   . Pneumonia    x 2 at age 89 and in 19  . PONV (postoperative nausea and vomiting)     PSH: Past Surgical History:  Procedure Laterality Date  . BACK SURGERY  2004   x2  . CARPAL TUNNEL RELEASE Bilateral 2009  . EYE SURGERY  20+ years ago   Lasik  . HERNIA REPAIR  2018    Social History:  reports that he has never smoked. He has never used smokeless tobacco. He reports that he drinks alcohol. He reports that he does not  use drugs.  Allergies:  No Known Allergies  Medications: No current facility-administered medications for this encounter.    Current Outpatient Medications  Medication Sig Dispense Refill  . atorvastatin (LIPITOR) 10 MG tablet Take 10 mg by mouth every morning.    [name] Kitchen lisinopril (PRINIVIL,ZESTRIL) 2.5 MG tablet Take 2.5 mg by mouth every morning.    . metFORMIN (GLUCOPHAGE) 500 MG tablet Take 500 mg by mouth 2 (two) times daily with a meal.      Results for orders placed or performed during the hospital encounter of 04/26/18 (from the past 48 hour(s))  Glucose, capillary     Status: None   Collection Time: 04/26/18  3:16 PM  Result Value Ref Range   Glucose-Capillary 84 70 - 99 mg/dL   Comment 1 Notify RN    Comment 2 Document in Chart   Surgical pcr screen     Status: None   Collection Time: 04/26/18  3:58 PM  Result Value Ref Range   MRSA, PCR NEGATIVE NEGATIVE   Staphylococcus aureus NEGATIVE NEGATIVE    Comment: (NOTE) The Xpert SA Assay (FDA approved for NASAL specimens in patients 45 years of age and older), is one component of a comprehensive surveillance program. It is not intended to diagnose infection nor to guide or monitor treatment. Performed at [facility]  [facility], [place] [address], [place], [place] [address]   Basic metabolic panel     Status: Abnormal   Collection Time: 04/26/18  3:58 PM  Result Value Ref Range   Sodium 141 135 - 145 mmol/L   Potassium 3.8 3.5 - 5.1 mmol/L   Chloride 108 98 - 111 mmol/L   CO2 23 22 - 32 mmol/L   Glucose, Bld 102 (H) 70 - 99 mg/dL   BUN 15 8 - 23 mg/dL   Creatinine, Ser 1.06 0.61 - 1.24 mg/dL   Calcium 9.1 8.9 - 10.3 mg/dL   GFR calc non Af Amer >60 >60 mL/min   GFR calc Af Amer >60 >60 mL/min    Comment: (NOTE) The eGFR has been calculated using the CKD EPI equation. This calculation has not been validated in all clinical situations. eGFR's persistently <60 mL/min signify possible Chronic Kidney Disease.    Anion gap  10 5 - 15    Comment: Performed at [facility] [address] [place] [address]  CBC     Status: None   Collection Time: 04/26/18  3:58 PM  Result Value Ref Range   WBC 7.3 4.0 - 10.5 K/uL   RBC 5.13 4.22 - 5.81 MIL/uL   Hemoglobin 14.9 13.0 - 17.0 g/dL   HCT 46.4 39.0 - 52.0 %   MCV 90.4 80.0 - 100.0 fL   MCH 29.0 26.0 - 34.0 pg   MCHC 32.1 30.0 - 36.0 g/dL   RDW 12.4 11.5 - 15.5 %   Platelets 241 150 - 400 K/uL   nRBC 0.0 0.0 - 0.2 %    Comment: Performed at [facility], [place] [address], [place], [place] [address]  Hemoglobin A1c     Status: Abnormal   Collection Time: 04/26/18  4:06 PM  Result Value Ref Range   Hgb A1c MFr Bld 6.3 (H) 4.8 - 5.6 %    Comment: (NOTE) Pre diabetes:          5.7%-6.4% Diabetes:              >6.4% Glycemic control for   <7.0% adults with diabetes    Mean Plasma Glucose 134.11 mg/dL    Comment: Performed at [facility] [address] [place], [place] [address]   No results found.  ROS: Pain with rom of the left lower extremity  Physical Exam: Alert and oriented 72 y.o. male in no acute distress Cranial nerves 2-12 intact Cervical spine: full rom with no tenderness, nv intact distally Chest: active breath sounds bilaterally, no wheeze rhonchi or rales Heart: regular rate and rhythm, no murmur Abd: non tender non distended with active bowel sounds Hip is stable with rom  Left knee medial and lateral joint line tenderness nv intact distally No rashes or edema distally Antalgic gait  Assessment/Plan Assessment: left knee end stage osteoarthritis  Plan:  Patient will undergo a left total knee by Dr. Veverly Fells at [facility]. Risks benefits and expectations were discussed with the patient. Patient understand risks, benefits and expectations and wishes to proceed. Preoperative templating of the joint replacement has been completed, documented, and submitted to the Operating Room personnel in order to optimize  intra-operative equipment management.   Merla Riches PA-C, MPAS [facility] is now [facility] [address], [place], [place],  [address] Phone: [phone] [email or web address] Facebook  [employer]

## 2018-04-27 NOTE — Progress Notes (Signed)
Error

## 2018-04-30 ENCOUNTER — Other Ambulatory Visit: Payer: Self-pay | Admitting: Orthopedic Surgery

## 2018-05-06 MED ORDER — TRANEXAMIC ACID-NACL 1000-0.7 MG/100ML-% IV SOLN
1000.0000 mg | INTRAVENOUS | Status: AC
  Administered 2018-05-07: 1000 mg via INTRAVENOUS
  Filled 2018-05-06: qty 100

## 2018-05-06 NOTE — Anesthesia Preprocedure Evaluation (Addendum)
Anesthesia Evaluation  Patient identified by MRN, date of birth, ID band Patient awake    Reviewed: Allergy & Precautions, NPO status , Patient's Chart, lab work & pertinent test results  History of Anesthesia Complications (+) PONVNegative for: history of anesthetic complications  Airway Mallampati: II  TM Distance: >3 FB Neck ROM: Full    Dental no notable dental hx.    Pulmonary neg pulmonary ROS,    Pulmonary exam normal        Cardiovascular hypertension, Normal cardiovascular exam     Neuro/Psych Hx of lower back surgery, no hardware negative psych ROS   GI/Hepatic negative GI ROS, Neg liver ROS,   Endo/Other  diabetes  Renal/GU negative Renal ROS  negative genitourinary   Musculoskeletal negative musculoskeletal ROS (+)   Abdominal   Peds  Hematology negative hematology ROS (+)   Anesthesia Other Findings   Reproductive/Obstetrics                            Anesthesia Physical Anesthesia Plan  ASA: II  Anesthesia Plan: Spinal   Post-op Pain Management:  Regional for Post-op pain   Induction:   PONV Risk Score and Plan: 2 and Propofol infusion, Treatment may vary due to age or medical condition and Ondansetron  Airway Management Planned: Natural Airway, Nasal Cannula and Simple Face Mask  Additional Equipment: None  Intra-op Plan:   Post-operative Plan:   Informed Consent: I have reviewed the patients History and Physical, chart, labs and discussed the procedure including the risks, benefits and alternatives for the proposed anesthesia with the patient or authorized representative who has indicated his/her understanding and acceptance.     Plan Discussed with:   Anesthesia Plan Comments:        Anesthesia Quick Evaluation

## 2018-05-07 ENCOUNTER — Inpatient Hospital Stay (HOSPITAL_COMMUNITY): Payer: Medicare Other | Admitting: Vascular Surgery

## 2018-05-07 ENCOUNTER — Inpatient Hospital Stay (HOSPITAL_COMMUNITY)
Admission: RE | Admit: 2018-05-07 | Discharge: 2018-05-10 | DRG: 470 | Disposition: A | Discharge status: Home / Self Care | Payer: Medicare Other | Source: Ambulatory Visit | Attending: Orthopedic Surgery | Admitting: Orthopedic Surgery

## 2018-05-07 ENCOUNTER — Other Ambulatory Visit: Payer: Self-pay

## 2018-05-07 ENCOUNTER — Inpatient Hospital Stay (HOSPITAL_COMMUNITY): Payer: Medicare Other

## 2018-05-07 ENCOUNTER — Encounter (HOSPITAL_COMMUNITY): Payer: Self-pay

## 2018-05-07 ENCOUNTER — Encounter (HOSPITAL_COMMUNITY)
Admission: RE | Disposition: A | Discharge status: Home / Self Care | Payer: Self-pay | Source: Ambulatory Visit | Attending: Orthopedic Surgery

## 2018-05-07 DIAGNOSIS — E119 Type 2 diabetes mellitus without complications: Secondary | ICD-10-CM | POA: Diagnosis present

## 2018-05-07 DIAGNOSIS — Z79899 Other long term (current) drug therapy: Secondary | ICD-10-CM

## 2018-05-07 DIAGNOSIS — I10 Essential (primary) hypertension: Secondary | ICD-10-CM | POA: Diagnosis not present

## 2018-05-07 DIAGNOSIS — Z96652 Presence of left artificial knee joint: Secondary | ICD-10-CM | POA: Diagnosis not present

## 2018-05-07 DIAGNOSIS — Z7984 Long term (current) use of oral hypoglycemic drugs: Secondary | ICD-10-CM | POA: Diagnosis not present

## 2018-05-07 DIAGNOSIS — M1712 Unilateral primary osteoarthritis, left knee: Secondary | ICD-10-CM | POA: Diagnosis not present

## 2018-05-07 DIAGNOSIS — G8918 Other acute postprocedural pain: Secondary | ICD-10-CM | POA: Diagnosis not present

## 2018-05-07 DIAGNOSIS — Z471 Aftercare following joint replacement surgery: Secondary | ICD-10-CM | POA: Diagnosis not present

## 2018-05-07 HISTORY — PX: TOTAL KNEE ARTHROPLASTY: SHX125

## 2018-05-07 LAB — GLUCOSE, CAPILLARY
GLUCOSE-CAPILLARY: 133 mg/dL — AB (ref 70–99)
GLUCOSE-CAPILLARY: 127 mg/dL — AB (ref 70–99)
GLUCOSE-CAPILLARY: 151 mg/dL — AB (ref 70–99)
Glucose-Capillary: 104 mg/dL — ABNORMAL HIGH (ref 70–99)
Glucose-Capillary: 162 mg/dL — ABNORMAL HIGH (ref 70–99)

## 2018-05-07 SURGERY — ARTHROPLASTY, KNEE, TOTAL
Anesthesia: Spinal | Site: Knee | Laterality: Left

## 2018-05-07 MED ORDER — HYDROMORPHONE HCL 1 MG/ML IJ SOLN
INTRAMUSCULAR | Status: AC
  Filled 2018-05-07: qty 1

## 2018-05-07 MED ORDER — OXYCODONE HCL 5 MG PO TABS
5.0000 mg | ORAL_TABLET | ORAL | Status: DC | PRN
  Administered 2018-05-07 – 2018-05-10 (×10): 10 mg via ORAL
  Administered 2018-05-10: 5 mg via ORAL
  Administered 2018-05-10: 10 mg via ORAL
  Filled 2018-05-07 (×12): qty 2

## 2018-05-07 MED ORDER — CEFAZOLIN SODIUM-DEXTROSE 2-4 GM/100ML-% IV SOLN
2.0000 g | Freq: Four times a day (QID) | INTRAVENOUS | Status: AC
  Administered 2018-05-07 (×2): 2 g via INTRAVENOUS
  Filled 2018-05-07 (×3): qty 100

## 2018-05-07 MED ORDER — METHOCARBAMOL 500 MG PO TABS: 500.0000 mg | ORAL_TABLET | Freq: Three times a day (TID) | ORAL | 1 refills | Status: DC | PRN

## 2018-05-07 MED ORDER — BISACODYL 10 MG RE SUPP
10.0000 mg | Freq: Every day | RECTAL | Status: DC | PRN
  Filled 2018-05-07: qty 1

## 2018-05-07 MED ORDER — ASPIRIN 81 MG PO CHEW
81.0000 mg | CHEWABLE_TABLET | Freq: Two times a day (BID) | ORAL | Status: DC
  Administered 2018-05-07 – 2018-05-10 (×6): 81 mg via ORAL
  Filled 2018-05-07 (×6): qty 1

## 2018-05-07 MED ORDER — ONDANSETRON HCL 4 MG/2ML IJ SOLN: 4.0000 mg | Freq: Once | INTRAMUSCULAR | Status: DC | PRN

## 2018-05-07 MED ORDER — CEFAZOLIN SODIUM-DEXTROSE 2-4 GM/100ML-% IV SOLN
2.0000 g | INTRAVENOUS | Status: AC
  Administered 2018-05-07: 2 g via INTRAVENOUS
  Filled 2018-05-07: qty 100

## 2018-05-07 MED ORDER — METHOCARBAMOL 1000 MG/10ML IJ SOLN
500.0000 mg | Freq: Four times a day (QID) | INTRAVENOUS | Status: DC | PRN
  Filled 2018-05-07: qty 5

## 2018-05-07 MED ORDER — MIDAZOLAM HCL 2 MG/2ML IJ SOLN
INTRAMUSCULAR | Status: AC
  Filled 2018-05-07: qty 2

## 2018-05-07 MED ORDER — LACTATED RINGERS IV SOLN
INTRAVENOUS | Status: DC | PRN
  Administered 2018-05-07 (×2): via INTRAVENOUS

## 2018-05-07 MED ORDER — ONDANSETRON HCL 4 MG/2ML IJ SOLN
INTRAMUSCULAR | Status: DC | PRN
  Administered 2018-05-07: 4 mg via INTRAVENOUS

## 2018-05-07 MED ORDER — DOCUSATE SODIUM 100 MG PO CAPS
100.0000 mg | ORAL_CAPSULE | Freq: Two times a day (BID) | ORAL | Status: DC
  Administered 2018-05-07 – 2018-05-10 (×6): 100 mg via ORAL
  Filled 2018-05-07 (×6): qty 1

## 2018-05-07 MED ORDER — PHENOL 1.4 % MT LIQD: 1.0000 | OROMUCOSAL | Status: DC | PRN

## 2018-05-07 MED ORDER — INSULIN ASPART 100 UNIT/ML ~~LOC~~ SOLN: 0.0000 [IU] | Freq: Every day | SUBCUTANEOUS | Status: DC

## 2018-05-07 MED ORDER — TRANEXAMIC ACID-NACL 1000-0.7 MG/100ML-% IV SOLN
1000.0000 mg | Freq: Once | INTRAVENOUS | Status: AC
  Administered 2018-05-07: 1000 mg via INTRAVENOUS
  Filled 2018-05-07: qty 100

## 2018-05-07 MED ORDER — HYDROMORPHONE HCL 1 MG/ML IJ SOLN
0.5000 mg | INTRAMUSCULAR | Status: DC | PRN
  Administered 2018-05-07 – 2018-05-08 (×5): 1 mg via INTRAVENOUS
  Filled 2018-05-07 (×4): qty 1

## 2018-05-07 MED ORDER — OXYCODONE HCL 5 MG/5ML PO SOLN: 5.0000 mg | Freq: Once | ORAL | Status: AC | PRN

## 2018-05-07 MED ORDER — OXYCODONE HCL 5 MG PO TABS
5.0000 mg | ORAL_TABLET | Freq: Once | ORAL | Status: AC | PRN
  Administered 2018-05-07: 5 mg via ORAL

## 2018-05-07 MED ORDER — METFORMIN HCL 500 MG PO TABS
500.0000 mg | ORAL_TABLET | Freq: Two times a day (BID) | ORAL | Status: DC
  Administered 2018-05-07 – 2018-05-10 (×6): 500 mg via ORAL
  Filled 2018-05-07 (×6): qty 1

## 2018-05-07 MED ORDER — ACETAMINOPHEN 325 MG PO TABS
325.0000 mg | ORAL_TABLET | Freq: Four times a day (QID) | ORAL | Status: DC | PRN
  Administered 2018-05-08 (×2): 325 mg via ORAL
  Administered 2018-05-09 (×2): 650 mg via ORAL
  Filled 2018-05-07 (×2): qty 1
  Filled 2018-05-07 (×2): qty 2

## 2018-05-07 MED ORDER — FERROUS SULFATE 325 (65 FE) MG PO TABS
325.0000 mg | ORAL_TABLET | Freq: Three times a day (TID) | ORAL | Status: DC
  Administered 2018-05-07 – 2018-05-10 (×8): 325 mg via ORAL
  Filled 2018-05-07 (×9): qty 1

## 2018-05-07 MED ORDER — ONDANSETRON HCL 4 MG/2ML IJ SOLN
4.0000 mg | Freq: Four times a day (QID) | INTRAMUSCULAR | Status: DC | PRN
  Administered 2018-05-07 – 2018-05-08 (×2): 4 mg via INTRAVENOUS
  Filled 2018-05-07 (×2): qty 2

## 2018-05-07 MED ORDER — SODIUM CHLORIDE 0.9 % IV SOLN
INTRAVENOUS | Status: DC | PRN
  Administered 2018-05-07: 25 ug/min via INTRAVENOUS

## 2018-05-07 MED ORDER — 0.9 % SODIUM CHLORIDE (POUR BTL) OPTIME
TOPICAL | Status: DC | PRN
  Administered 2018-05-07: 1000 mL

## 2018-05-07 MED ORDER — METOCLOPRAMIDE HCL 5 MG PO TABS
5.0000 mg | ORAL_TABLET | Freq: Three times a day (TID) | ORAL | Status: DC | PRN
  Administered 2018-05-08 – 2018-05-09 (×2): 10 mg via ORAL
  Filled 2018-05-07 (×2): qty 2

## 2018-05-07 MED ORDER — CHLORHEXIDINE GLUCONATE 4 % EX LIQD: 60.0000 mL | Freq: Once | CUTANEOUS | Status: DC

## 2018-05-07 MED ORDER — MIDAZOLAM HCL 2 MG/2ML IJ SOLN
INTRAMUSCULAR | Status: DC | PRN
  Administered 2018-05-07: 1 mg via INTRAVENOUS

## 2018-05-07 MED ORDER — ASPIRIN 81 MG PO CHEW: 81.0000 mg | CHEWABLE_TABLET | Freq: Two times a day (BID) | ORAL | 0 refills | Status: DC

## 2018-05-07 MED ORDER — FENTANYL CITRATE (PF) 100 MCG/2ML IJ SOLN
25.0000 ug | INTRAMUSCULAR | Status: DC | PRN
  Administered 2018-05-07 (×2): 50 ug via INTRAVENOUS

## 2018-05-07 MED ORDER — PROPOFOL 10 MG/ML IV BOLUS
INTRAVENOUS | Status: DC | PRN
  Administered 2018-05-07: 20 mg via INTRAVENOUS

## 2018-05-07 MED ORDER — LISINOPRIL 2.5 MG PO TABS
2.5000 mg | ORAL_TABLET | ORAL | Status: DC
  Administered 2018-05-08 – 2018-05-10 (×2): 2.5 mg via ORAL
  Filled 2018-05-07 (×3): qty 1

## 2018-05-07 MED ORDER — ATORVASTATIN CALCIUM 10 MG PO TABS
10.0000 mg | ORAL_TABLET | ORAL | Status: DC
  Administered 2018-05-08 – 2018-05-10 (×3): 10 mg via ORAL
  Filled 2018-05-07 (×3): qty 1

## 2018-05-07 MED ORDER — PROPOFOL 10 MG/ML IV BOLUS
INTRAVENOUS | Status: AC
  Filled 2018-05-07: qty 20

## 2018-05-07 MED ORDER — PROPOFOL 500 MG/50ML IV EMUL
INTRAVENOUS | Status: DC | PRN
  Administered 2018-05-07: 50 ug/kg/min via INTRAVENOUS

## 2018-05-07 MED ORDER — FENTANYL CITRATE (PF) 100 MCG/2ML IJ SOLN
INTRAMUSCULAR | Status: AC
  Filled 2018-05-07: qty 2

## 2018-05-07 MED ORDER — LIDOCAINE 2% (20 MG/ML) 5 ML SYRINGE
INTRAMUSCULAR | Status: AC
  Filled 2018-05-07: qty 5

## 2018-05-07 MED ORDER — POLYETHYLENE GLYCOL 3350 17 G PO PACK
17.0000 g | PACK | Freq: Every day | ORAL | Status: DC | PRN
  Administered 2018-05-08 – 2018-05-10 (×2): 17 g via ORAL
  Filled 2018-05-07 (×2): qty 1

## 2018-05-07 MED ORDER — SODIUM CHLORIDE 0.9 % IR SOLN
Status: DC | PRN
  Administered 2018-05-07: 3000 mL

## 2018-05-07 MED ORDER — INSULIN ASPART 100 UNIT/ML ~~LOC~~ SOLN
0.0000 [IU] | Freq: Three times a day (TID) | SUBCUTANEOUS | Status: DC
  Administered 2018-05-07 – 2018-05-09 (×6): 3 [IU] via SUBCUTANEOUS
  Administered 2018-05-09: 2 [IU] via SUBCUTANEOUS
  Administered 2018-05-10: 3 [IU] via SUBCUTANEOUS

## 2018-05-07 MED ORDER — MENTHOL 3 MG MT LOZG: 1.0000 | LOZENGE | OROMUCOSAL | Status: DC | PRN

## 2018-05-07 MED ORDER — METOCLOPRAMIDE HCL 5 MG/ML IJ SOLN
5.0000 mg | Freq: Three times a day (TID) | INTRAMUSCULAR | Status: DC | PRN
  Administered 2018-05-07: 10 mg via INTRAVENOUS
  Filled 2018-05-07: qty 2

## 2018-05-07 MED ORDER — ONDANSETRON HCL 4 MG PO TABS
4.0000 mg | ORAL_TABLET | Freq: Four times a day (QID) | ORAL | Status: DC | PRN
  Administered 2018-05-09 – 2018-05-10 (×2): 4 mg via ORAL
  Filled 2018-05-07 (×2): qty 1

## 2018-05-07 MED ORDER — FENTANYL CITRATE (PF) 250 MCG/5ML IJ SOLN
INTRAMUSCULAR | Status: DC | PRN
  Administered 2018-05-07 (×2): 50 ug via INTRAVENOUS

## 2018-05-07 MED ORDER — ROPIVACAINE HCL 5 MG/ML IJ SOLN
INTRAMUSCULAR | Status: DC | PRN
  Administered 2018-05-07: 30 mL via PERINEURAL

## 2018-05-07 MED ORDER — ONDANSETRON HCL 4 MG/2ML IJ SOLN
INTRAMUSCULAR | Status: AC
  Filled 2018-05-07: qty 2

## 2018-05-07 MED ORDER — OXYCODONE-ACETAMINOPHEN 5-325 MG PO TABS: 1.0000 | ORAL_TABLET | ORAL | 0 refills | Status: AC | PRN

## 2018-05-07 MED ORDER — OXYCODONE HCL 5 MG PO TABS
ORAL_TABLET | ORAL | Status: AC
  Filled 2018-05-07: qty 1

## 2018-05-07 MED ORDER — BUPIVACAINE IN DEXTROSE 0.75-8.25 % IT SOLN
INTRATHECAL | Status: DC | PRN
  Administered 2018-05-07: 2 mL via INTRATHECAL

## 2018-05-07 MED ORDER — METHOCARBAMOL 500 MG PO TABS
500.0000 mg | ORAL_TABLET | Freq: Four times a day (QID) | ORAL | Status: DC | PRN
  Administered 2018-05-07 – 2018-05-10 (×3): 500 mg via ORAL
  Filled 2018-05-07 (×3): qty 1

## 2018-05-07 MED ORDER — SODIUM CHLORIDE 0.9 % IV SOLN
INTRAVENOUS | Status: DC
  Administered 2018-05-07: 13:00:00 via INTRAVENOUS

## 2018-05-07 MED ORDER — FENTANYL CITRATE (PF) 250 MCG/5ML IJ SOLN
INTRAMUSCULAR | Status: AC
  Filled 2018-05-07: qty 5

## 2018-05-07 MED ORDER — INSULIN ASPART 100 UNIT/ML ~~LOC~~ SOLN
4.0000 [IU] | Freq: Three times a day (TID) | SUBCUTANEOUS | Status: DC
  Administered 2018-05-07 – 2018-05-10 (×8): 4 [IU] via SUBCUTANEOUS

## 2018-05-07 SURGICAL SUPPLY — 65 items
BANDAGE ESMARK 6X9 LF (GAUZE/BANDAGES/DRESSINGS) ×2 IMPLANT
BLADE SAG 18X100X1.27 (BLADE) ×3 IMPLANT
BLADE SAW SGTL 13X75X1.27 (BLADE) ×3 IMPLANT
BNDG ELASTIC 6X10 VLCR STRL LF (GAUZE/BANDAGES/DRESSINGS) ×3 IMPLANT
BNDG ESMARK 6X9 LF (GAUZE/BANDAGES/DRESSINGS) ×6
BNDG GAUZE ELAST 4 BULKY (GAUZE/BANDAGES/DRESSINGS) ×6 IMPLANT
BOWL SMART MIX CTS (DISPOSABLE) ×3 IMPLANT
CEMENT HV SMART SET (Cement) ×6 IMPLANT
CEMENT TIBIA MBT SIZE 5 (Knees) ×1 IMPLANT
CLOSURE STERI-STRIP 1/2X4 (GAUZE/BANDAGES/DRESSINGS) ×2
CLOSURE WOUND 1/2 X4 (GAUZE/BANDAGES/DRESSINGS) ×2
CLSR STERI-STRIP ANTIMIC 1/2X4 (GAUZE/BANDAGES/DRESSINGS) ×4 IMPLANT
COVER SURGICAL LIGHT HANDLE (MISCELLANEOUS) ×3 IMPLANT
COVER WAND RF STERILE (DRAPES) ×3 IMPLANT
CUFF TOURNIQUET SINGLE 34IN LL (TOURNIQUET CUFF) IMPLANT
CUFF TOURNIQUET SINGLE 44IN (TOURNIQUET CUFF) IMPLANT
DRAPE EXTREMITY T 121X128X90 (DRAPE) ×3 IMPLANT
DRAPE HALF SHEET 40X57 (DRAPES) ×3 IMPLANT
DRAPE U-SHAPE 47X51 STRL (DRAPES) ×3 IMPLANT
DRSG ADAPTIC 3X8 NADH LF (GAUZE/BANDAGES/DRESSINGS) ×3 IMPLANT
DRSG PAD ABDOMINAL 8X10 ST (GAUZE/BANDAGES/DRESSINGS) ×6 IMPLANT
DURAPREP 26ML APPLICATOR (WOUND CARE) ×3 IMPLANT
ELECT CAUTERY BLADE 6.4 (BLADE) ×3 IMPLANT
ELECT REM PT RETURN 9FT ADLT (ELECTROSURGICAL) ×3
ELECTRODE REM PT RTRN 9FT ADLT (ELECTROSURGICAL) ×1 IMPLANT
FEMUR SIGMA PS SZ 5.0 L (Femur) ×3 IMPLANT
GAUZE SPONGE 4X4 12PLY STRL (GAUZE/BANDAGES/DRESSINGS) ×3 IMPLANT
GLOVE BIOGEL PI ORTHO PRO 7.5 (GLOVE) ×2
GLOVE BIOGEL PI ORTHO PRO SZ8 (GLOVE) ×2
GLOVE ORTHO TXT STRL SZ7.5 (GLOVE) ×3 IMPLANT
GLOVE PI ORTHO PRO STRL 7.5 (GLOVE) ×1 IMPLANT
GLOVE PI ORTHO PRO STRL SZ8 (GLOVE) ×1 IMPLANT
GLOVE SURG ORTHO 8.5 STRL (GLOVE) ×3 IMPLANT
GOWN STRL REUS W/ TWL XL LVL3 (GOWN DISPOSABLE) ×3 IMPLANT
GOWN STRL REUS W/TWL XL LVL3 (GOWN DISPOSABLE) ×6
HANDPIECE INTERPULSE COAX TIP (DISPOSABLE) ×2
IMMOBILIZER KNEE 22 (SOFTGOODS) ×3 IMPLANT
IMMOBILIZER KNEE 22 UNIV (SOFTGOODS) IMPLANT
KIT BASIN OR (CUSTOM PROCEDURE TRAY) ×3 IMPLANT
KIT MANIFOLD (MISCELLANEOUS) ×3 IMPLANT
KIT TURNOVER KIT B (KITS) ×3 IMPLANT
MANIFOLD NEPTUNE II (INSTRUMENTS) ×3 IMPLANT
NS IRRIG 1000ML POUR BTL (IV SOLUTION) ×3 IMPLANT
PACK TOTAL JOINT (CUSTOM PROCEDURE TRAY) ×3 IMPLANT
PAD ABD 8X10 STRL (GAUZE/BANDAGES/DRESSINGS) ×3 IMPLANT
PAD ARMBOARD 7.5X6 YLW CONV (MISCELLANEOUS) ×6 IMPLANT
PATELLA DOME PFC 38MM (Knees) ×3 IMPLANT
PIN STEINMAN FIXATION KNEE (PIN) ×3 IMPLANT
PLATE ROT INSERT 10MM SIZE 5 (Plate) ×3 IMPLANT
SET HNDPC FAN SPRY TIP SCT (DISPOSABLE) ×1 IMPLANT
STRIP CLOSURE SKIN 1/2X4 (GAUZE/BANDAGES/DRESSINGS) ×4 IMPLANT
SUCTION FRAZIER HANDLE 10FR (MISCELLANEOUS) ×2
SUCTION TUBE FRAZIER 10FR DISP (MISCELLANEOUS) ×1 IMPLANT
SUT MNCRL AB 3-0 PS2 18 (SUTURE) ×3 IMPLANT
SUT VIC AB 0 CT1 27 (SUTURE) ×4
SUT VIC AB 0 CT1 27XBRD ANBCTR (SUTURE) ×2 IMPLANT
SUT VIC AB 1 CT1 27 (SUTURE) ×6
SUT VIC AB 1 CT1 27XBRD ANBCTR (SUTURE) ×3 IMPLANT
SUT VIC AB 2-0 CT1 27 (SUTURE) ×4
SUT VIC AB 2-0 CT1 TAPERPNT 27 (SUTURE) ×2 IMPLANT
TIBIA MBT CEMENT SIZE 5 (Knees) ×3 IMPLANT
TOWEL OR 17X24 6PK STRL BLUE (TOWEL DISPOSABLE) ×3 IMPLANT
TOWEL OR 17X26 10 PK STRL BLUE (TOWEL DISPOSABLE) ×3 IMPLANT
TRAY CATH 16FR W/PLASTIC CATH (SET/KITS/TRAYS/PACK) IMPLANT
TRAY FOLEY MTR SLVR 16FR STAT (SET/KITS/TRAYS/PACK) IMPLANT

## 2018-05-07 NOTE — Care Plan (Signed)
Ortho Bundle Case Management Note  Patient Details  Name: LUMAN HOLWAY MRN: [id number] Date of Birth: 28-Mar-1946  DCP:  Home with spouse.  Lives in a 1 story home with 4 ste. DME:  RW ordered through [facility].  Patient does not want/need a 3-in-1. PT:  OP PT scheduled at [facility] on 05-11-18 at 1:30 pm                  DME Arranged:  Gilford Rile rolling DME Agency:  [employer]  HH Arranged:  NA [place] Agency:  NA  Additional Comments: Please contact me with any questions of if this plan should need to change.  Marianne Sofia, RN,CCM [facility]  [phone] 05/07/2018, 7:38 AM

## 2018-05-07 NOTE — Discharge Instructions (Signed)
Ice to the Left Knee constantly.  Keep the incision covered and clean and dry for one week, then ok to get it wet in the shower.  Do exercise as instructed several times per day.  DO NOT prop anything under the knee.  Sleep with the knee immobilizer on to maintain knee extension at night.  Use the walker while you are up and around for balance, you can put full weight on the leg.  Take baby aspirin twice daily chewable) and wear stockings on both legs 24/7 to prevent blood clots for 30 days.  Follow up with Dr Veverly Fells in two weeks in the office, call [phone] for appt

## 2018-05-07 NOTE — Anesthesia Procedure Notes (Signed)
Anesthesia Regional Block: Adductor canal block   Pre-Anesthetic Checklist: ,, timeout performed, Correct Patient, Correct Site, Correct Laterality, Correct Procedure, Correct Position, site marked, Risks and benefits discussed,  Surgical consent,  Pre-op evaluation,  At surgeon's request and post-op pain management  Laterality: Left  Prep: chloraprep       Needles:  Injection technique: Single-shot  Needle Type: Echogenic Stimulator Needle     Needle Length: 9cm  Needle Gauge: 21     Additional Needles:   Procedures:,,,, ultrasound used (permanent image in chart),,,,  Narrative:  Start time: 05/07/2018 7:00 AM End time: 05/07/2018 7:10 AM Injection made incrementally with aspirations every 5 mL.  Performed by: Personally  Anesthesiologist: Lidia Collum, MD  Additional Notes: Monitors applied. Injection made in 5cc increments. No resistance to injection. Good needle visualization. Patient tolerated procedure well.

## 2018-05-07 NOTE — Progress Notes (Signed)
Orthopedic Tech Progress Note Patient Details:  Jerry Dodson 02-Jul-1945 [id number]  CPM Left Knee CPM Left Knee: On Left Knee Flexion (Degrees): 60 Left Knee Extension (Degrees): 0 Additional Comments: Trapeze bar  Post Interventions Patient Tolerated: Well Instructions Provided: Care of device  [name] 05/07/2018, 10:58 AM

## 2018-05-07 NOTE — Transfer of Care (Signed)
Immediate Anesthesia Transfer of Care Note  Patient: Jerry Dodson  Procedure(s) Performed: LEFT TOTAL KNEE ARTHROPLASTY (Left Knee)  Patient Location: PACU  Anesthesia Type:Spinal  Level of Consciousness: drowsy and patient cooperative  Airway & Oxygen Therapy: Patient Spontanous Breathing and Patient connected to face mask oxygen  Post-op Assessment: Report given to RN and Post -op Vital signs reviewed and stable  Post vital signs: Reviewed and stable  Last Vitals:  Vitals Value Taken Time  BP 94/52 05/07/2018  9:44 AM  Temp    Pulse 64 05/07/2018  9:45 AM  Resp 16 05/07/2018  9:45 AM  SpO2 100 % 05/07/2018  9:45 AM  Vitals shown include unvalidated device data.  Last Pain:  Vitals:   05/07/18 0603  TempSrc:   PainSc: 5       Patients Stated Pain Goal: 3 (66/44/03 4742)  Complications: No apparent anesthesia complications

## 2018-05-07 NOTE — Brief Op Note (Signed)
05/07/2018  9:38 AM  PATIENT:  Jerry Dodson  72 y.o. male  PRE-OPERATIVE DIAGNOSIS:  Left knee osteoarthritis, end stage  POST-OPERATIVE DIAGNOSIS:  Left knee osteoarthritis,end stage  PROCEDURE:  Procedure(s): LEFT TOTAL KNEE ARTHROPLASTY (Left) DePuy Sigma RP  SURGEON:  Surgeon(s) and Role:    Netta Cedars, MD - Primary  PHYSICIAN ASSISTANT:   ASSISTANTS: Ventura Bruns, PA-C   ANESTHESIA:   Adductor canal block and spinal  EBL:  minimal   BLOOD ADMINISTERED:none  DRAINS: none   LOCAL MEDICATIONS USED: none  SPECIMEN:  No Specimen  DISPOSITION OF SPECIMEN:  N/A  COUNTS:  YES  TOURNIQUET:   Total Tourniquet Time Documented: Thigh (Left) - 92 minutes Total: Thigh (Left) - 92 minutes   DICTATION: .Other Dictation: Dictation Number [id number]  PLAN OF CARE: Admit to inpatient   PATIENT DISPOSITION:  PACU - hemodynamically stable.   Delay start of Pharmacological VTE agent (>24hrs) due to surgical blood loss or risk of bleeding: no

## 2018-05-07 NOTE — Plan of Care (Signed)
  Problem: Activity: Goal: Ability to avoid complications of mobility impairment will improve Outcome: Progressing Goal: Range of joint motion will improve Outcome: Progressing   Problem: Pain Management: Goal: Pain level will decrease with appropriate interventions Outcome: Progressing   

## 2018-05-07 NOTE — Interval H&P Note (Signed)
History and Physical Interval Note:  05/07/2018 7:26 AM  Jerry Dodson  has presented today for surgery, with the diagnosis of Left knee osteoarthritis  The various methods of treatment have been discussed with the patient and family. After consideration of risks, benefits and other options for treatment, the patient has consented to  Procedure(s): LEFT TOTAL KNEE ARTHROPLASTY (Left) as a surgical intervention .  The patient's history has been reviewed, patient examined, no change in status, stable for surgery.  I have reviewed the patient's chart and labs.  Questions were answered to the patient's satisfaction.     Vallory Oetken,STEVEN R

## 2018-05-07 NOTE — Anesthesia Postprocedure Evaluation (Signed)
Anesthesia Post Note  Patient: Jerry Dodson  Procedure(s) Performed: LEFT TOTAL KNEE ARTHROPLASTY (Left Knee)     Patient location during evaluation: PACU Anesthesia Type: Spinal Level of consciousness: oriented and awake and alert Pain management: pain level controlled Vital Signs Assessment: post-procedure vital signs reviewed and stable Respiratory status: spontaneous breathing and respiratory function stable Cardiovascular status: blood pressure returned to baseline and stable Postop Assessment: no headache, no backache, no apparent nausea or vomiting and spinal receding Anesthetic complications: no    Last Vitals:  Vitals:   05/07/18 1210 05/07/18 1228  BP: 126/73 134/81  Pulse: 74 76  Resp: 12 18  Temp:  36.4 C  SpO2: 95% 96%    Last Pain:  Vitals:   05/07/18 1318  TempSrc:   PainSc: 10-Worst pain ever                 Lidia Collum

## 2018-05-07 NOTE — Op Note (Signed)
NAME: Jerry Dodson, DUNKER MEDICAL RECORD JY:[id number] ACCOUNT [id number] DATE OF BIRTH:19-May-1946 FACILITY: [facility] LOCATION: [facility] PHYSICIAN:STEVEN Orlena Sheldon, MD  OPERATIVE REPORT  DATE OF PROCEDURE:  05/07/2018  PREOPERATIVE DIAGNOSIS:  Left knee end-stage osteoarthritis.  POSTOPERATIVE DIAGNOSIS:  Left knee end-stage osteoarthritis.  PROCEDURE PERFORMED:  Left total knee arthroplasty using DePuy Sigma rotating platform prosthesis.  ATTENDING SURGEON:  Esmond Plants, MD  ASSISTANT:  Darol Destine, [place], who was scrubbed during the entire procedure and necessary for satisfactory completion of surgery.  ANESTHESIA:   Spinal anesthesia was used plus adductor canal block.  ESTIMATED BLOOD LOSS:  Minimal.  FLUID REPLACEMENT:  1500 mL crystalloid.  INSTRUMENT COUNTS:  Correct.  COMPLICATIONS:  No complications.  ANTIBIOTICS:  Perioperative antibiotics were given.  TOURNIQUET TIME:  1  hour and 20 minutes at 300 mmHg.  INDICATIONS:  The patient is a 72 year old male with worsening left knee pain secondary to end-stage arthritis in his knee.  The patient has bone-on-bone changes.  MRI scan showing advanced chondromalacia and edema on both sides of the joint.  Given the  failure of conservative management and progressive pain and interference with ADLs and sleep, he requested total knee arthroplasty to restore function and eliminate pain.  Informed consent obtained.  DESCRIPTION OF PROCEDURE:  After an adequate level of anesthesia was achieved, the patient was positioned supine on the operating table.  Left leg correctly identified.  Nonsterile tourniquet placed on proximal thigh.  Left leg sterilely prepped and  draped in the usual manner.  Timeout called.  We then elevated the knee and exsanguinated using Esmarch bandage.  We then placed the knee in flexion and performed a midline incision with a 10 blade scalpel.  Dissection down through the subcutaneous  tissues.  We  identified the peripatellar tissues and performed a medial parapatellar arthrotomy.  We divided the lateral patellofemoral ligaments and everted the patella.  We entered the distal femur with a step cut drill.  We then placed our  intramedullary resection guide and resected 10 mm of bone off the left side, set on 5 degrees left.  We then sized the femur to size 5 anterior down and performed anterior, posterior and chamfer cuts.  We then removed ACL, PCL, meniscal tissue, subluxed  the tibia anteriorly and performed our tibial cut with an external alignment jig, resecting 4 mm off the affected medial side with minimal posterior slope for this posterior cruciate substituting prosthesis.  We were perpendicular to the long axis of the  tibia.  Once we had our tibial cut, we removed excess posterior osteophytes using a lamina spreader off the posterior femur.  We then checked our gaps, which were symmetric at 10 mm.  We then went ahead and completed our tibial preparation with the  modular drill and keel punch and did a size 5 tibia.  We then went to the femur and did our box cut using the box cut guide for the 5 left femur and then placed our trial 5 femur in place.  Next, we went ahead and placed a 10 mm poly trial in place and  then reduced the knee.  We had nice little pop in the medial side and we were able to get full extension.  We then resurfaced the patella going from 27 mm thickness down to a 16 mm thickness.  We drilled lug holes for the 38 patella and placed that  patellar trial in place and we ranged the knee.  We had nice patellar  tracking with no-touch technique.  We irrigated thoroughly, removed all trial components, pulse irrigated the bone and dried thoroughly.  We then vacuum mixed high viscosity cement by  DePuy on the back table and cemented the components into place, a size 5 tibia size Hi-Fi left femur, a 10 poly trial and the 38 patella.  We used a patellar clamp to secure the patella until  the cement hardened, we removed excess cement with quarter  inch curved osteotome.  We did a final inspection of the entire knee joint and selected the real size 5, 10 mm poly and placed that into position and then reduced the knee and again a little pop.  We reduced the medial condyle.  We were able to get full  extension, nice stability on flexion.  We irrigated thoroughly and then repaired the parapatellar arthrotomy with #1 Vicryl suture followed by 2-0 Vicryl for subcutaneous closure and 4-0 Monocryl for skin.  Steri-Strips applied followed by sterile  dressing.    The patient tolerated surgery well.  AN/NUANCE  D:05/07/2018 T:05/07/2018 JOB:[id number]

## 2018-05-07 NOTE — Evaluation (Signed)
Physical Therapy Evaluation Patient Details Name: Jerry Dodson MRN: [id number] DOB: [birth year] Today's Date: 05/07/2018   History of Present Illness  Pt is a 72 y/o male s/p elective L TKA. PMH includes DM, HTN, and back surgery.   Clinical Impression  Pt is s/p surgery above with deficits below. Pt very limited by pain and only able to tolerate a few side steps at EOB. Required min to min guard A for mobility this session. Reviewed knee precautions and supine HEP, however, tolerance for HEP was limited. Will continue to follow acutely to maximize functional mobility independence and safety.     Follow Up Recommendations Follow surgeon's recommendation for DC plan and follow-up therapies;Supervision for mobility/OOB    Equipment Recommendations  Rolling walker with 5" wheels;3in1 (PT)    Recommendations for Other Services OT consult     Precautions / Restrictions Precautions Precautions: Knee Precaution Booklet Issued: Yes (comment) Precaution Comments: Reviewed knee precautions with pt.  Restrictions Weight Bearing Restrictions: Yes LLE Weight Bearing: Weight bearing as tolerated      Mobility  Bed Mobility Overal bed mobility: Needs Assistance Bed Mobility: Supine to Sit;Sit to Supine     Supine to sit: Min assist Sit to supine: Min assist   General bed mobility comments: Min A for LLE assist throughout bed mobility. Increased time required to perfom.   Transfers Overall transfer level: Needs assistance Equipment used: Rolling walker (2 wheeled) Transfers: Sit to/from Stand Sit to Stand: Min assist;From elevated surface         General transfer comment: Min A for lift assist and steadying from elevated surface. Verbal cues for safe hand placement.   Ambulation/Gait Ambulation/Gait assistance: Min guard   Assistive device: Rolling walker (2 wheeled)   Gait velocity: Decreased    General Gait Details: Took side steps at EOB, however, further mobility  limited secondary to increased pain. Very limited weightshift to LLE during side steps.    Stairs            Wheelchair Mobility    Modified Rankin (Stroke Patients Only)       Balance Overall balance assessment: Needs assistance Sitting-balance support: No upper extremity supported;Feet supported Sitting balance-Leahy Scale: Good     Standing balance support: Bilateral upper extremity supported;During functional activity Standing balance-Leahy Scale: Poor Standing balance comment: Reliant on BUE support.                              Pertinent Vitals/Pain Pain Assessment: 0-10 Pain Score: 8  Pain Location: L knee  Pain Descriptors / Indicators: Operative site guarding;Constant Pain Intervention(s): Limited activity within patient's tolerance;Monitored during session;Repositioned    Home Living Family/patient expects to be discharged to:: Private residence Living Arrangements: Spouse/significant other Available Help at Discharge: Family;Available 24 hours/day Type of Home: House Home Access: Stairs to enter Entrance Stairs-Rails: None Entrance Stairs-Number of Steps: 3 Home Layout: One level Home Equipment: None      Prior Function Level of Independence: Independent               Hand Dominance        Extremity/Trunk Assessment   Upper Extremity Assessment Upper Extremity Assessment: Defer to OT evaluation    Lower Extremity Assessment Lower Extremity Assessment: LLE deficits/detail LLE Deficits / Details: Deficits consistent with post op pain and weakness. Able to perform ther ex below.     Cervical / Trunk Assessment Cervical / Trunk Assessment: Normal  Communication   Communication: No difficulties  Cognition Arousal/Alertness: Awake/alert Behavior During Therapy: WFL for tasks assessed/performed Overall Cognitive Status: Within Functional Limits for tasks assessed                                         General Comments      Exercises Total Joint Exercises Ankle Circles/Pumps: AROM;Both;20 reps Quad Sets: AROM;Left;5 reps   Assessment/Plan    PT Assessment Patient needs continued PT services  PT Problem List Decreased strength;Decreased range of motion;Decreased balance;Decreased activity tolerance;Decreased mobility;Decreased knowledge of use of DME;Decreased knowledge of precautions;Pain       PT Treatment Interventions DME instruction;Gait training;Therapeutic exercise;Therapeutic activities;Functional mobility training;Stair training;Balance training;Patient/family education    PT Goals (Current goals can be found in the Care Plan section)  Acute Rehab PT Goals Patient Stated Goal: to go home  PT Goal Formulation: With patient Time For Goal Achievement: 05/21/18 Potential to Achieve Goals: Good    Frequency 7X/week   Barriers to discharge        Co-evaluation               AM-PAC PT "6 Clicks" Daily Activity  Outcome Measure Difficulty turning over in bed (including adjusting bedclothes, sheets and blankets)?: A Little Difficulty moving from lying on back to sitting on the side of the bed? : Unable Difficulty sitting down on and standing up from a chair with arms (e.g., wheelchair, bedside commode, etc,.)?: Unable Help needed moving to and from a bed to chair (including a wheelchair)?: A Little Help needed walking in hospital room?: A Little Help needed climbing 3-5 steps with a railing? : A Lot 6 Click Score: 13    End of Session Equipment Utilized During Treatment: Left knee immobilizer Activity Tolerance: Patient limited by pain Patient left: in bed;with call bell/phone within reach Nurse Communication: Mobility status PT Visit Diagnosis: Muscle weakness (generalized) (M62.81);Unsteadiness on feet (R26.81);Other abnormalities of gait and mobility (R26.89);Pain Pain - Right/Left: Left Pain - part of body: Knee    Time: 3704-8889 PT Time Calculation  (min) (ACUTE ONLY): 20 min   Charges:   PT Evaluation $PT Eval Low Complexity: Jerry Dodson, PT, DPT  Acute Rehabilitation Services  Pager: [phone] Office: [phone]   Rudean Hitt 05/07/2018, 6:16 PM

## 2018-05-07 NOTE — Anesthesia Procedure Notes (Signed)
Spinal  Patient location during procedure: OR Staffing Anesthesiologist: Glorimar Stroope E, MD Performed: anesthesiologist  Preanesthetic Checklist Completed: patient identified, surgical consent, pre-op evaluation, timeout performed, IV checked, risks and benefits discussed and monitors and equipment checked Spinal Block Patient position: sitting Prep: site prepped and draped and DuraPrep Patient monitoring: continuous pulse ox, blood pressure and heart rate Approach: midline Location: L3-4 Injection technique: single-shot Needle Needle type: Pencan  Needle gauge: 24 G Needle length: 9 cm Additional Notes Functioning IV was confirmed and monitors were applied. Sterile prep and drape, including hand hygiene and sterile gloves were used. The patient was positioned and the spine was prepped. The skin was anesthetized with lidocaine.  Free flow of clear CSF was obtained prior to injecting local anesthetic into the CSF. The needle was carefully withdrawn. The patient tolerated the procedure well.      

## 2018-05-08 LAB — BASIC METABOLIC PANEL
Anion gap: 6 (ref 5–15)
BUN: 13 mg/dL (ref 8–23)
CALCIUM: 8.3 mg/dL — AB (ref 8.9–10.3)
CO2: 25 mmol/L (ref 22–32)
Chloride: 106 mmol/L (ref 98–111)
Creatinine, Ser: 1.05 mg/dL (ref 0.61–1.24)
GLUCOSE: 152 mg/dL — AB (ref 70–99)
Potassium: 3.9 mmol/L (ref 3.5–5.1)
SODIUM: 137 mmol/L (ref 135–145)

## 2018-05-08 LAB — CBC
HCT: 41 % (ref 39.0–52.0)
Hemoglobin: 12.9 g/dL — ABNORMAL LOW (ref 13.0–17.0)
MCH: 28.3 pg (ref 26.0–34.0)
MCHC: 31.5 g/dL (ref 30.0–36.0)
MCV: 89.9 fL (ref 80.0–100.0)
NRBC: 0 % (ref 0.0–0.2)
PLATELETS: 216 10*3/uL (ref 150–400)
RBC: 4.56 MIL/uL (ref 4.22–5.81)
RDW: 12.3 % (ref 11.5–15.5)
WBC: 10.6 10*3/uL — AB (ref 4.0–10.5)

## 2018-05-08 LAB — GLUCOSE, CAPILLARY
GLUCOSE-CAPILLARY: 161 mg/dL — AB (ref 70–99)
Glucose-Capillary: 161 mg/dL — ABNORMAL HIGH (ref 70–99)
Glucose-Capillary: 161 mg/dL — ABNORMAL HIGH (ref 70–99)
Glucose-Capillary: 106 mg/dL — ABNORMAL HIGH (ref 70–99)

## 2018-05-08 MED ORDER — KETOROLAC TROMETHAMINE 15 MG/ML IJ SOLN
7.5000 mg | Freq: Four times a day (QID) | INTRAMUSCULAR | Status: AC
  Administered 2018-05-08 – 2018-05-09 (×5): 7.5 mg via INTRAVENOUS
  Filled 2018-05-08 (×5): qty 1

## 2018-05-08 NOTE — Plan of Care (Signed)
  Problem: Safety: Goal: Ability to remain free from injury will improve Outcome: Progressing   Problem: Pain Managment: Goal: General experience of comfort will improve Outcome: Progressing   Problem: Activity: Goal: Ability to avoid complications of mobility impairment will improve Outcome: Progressing   Problem: Skin Integrity: Goal: Risk for impaired skin integrity will decrease Outcome: Progressing   Problem: Education: Goal: Knowledge of General Education information will improve Description Including pain rating scale, medication(s)/side effects and non-pharmacologic comfort measures Outcome: Progressing

## 2018-05-08 NOTE — Progress Notes (Signed)
Physical Therapy Treatment Patient Details Name: Jerry Dodson MRN: [id number] DOB: 03-23-1946 Today's Date: 05/08/2018    History of Present Illness Pt is a 72 y/o male s/p elective L TKA. PMH includes DM, HTN, and back surgery.     PT Comments    Pt was in bed with no family present upon arrival. Pt c/o of pain in L knee and stated it was the worst pain he has ever felt. Pt was willing to participate in supine exercises shown in HEP. The exercises took increased time to perform due to pain. Pt agreeable to try to ambulate this session, but upon standing c/o nausea and began dry heaving after taking 3-4 steps. Pt was left in chair all needs met and with call bell within reach. Pt continues to make progress toward stated goals and would benefit from continued skilled PT in order to maximize functional independence. Pt remains appropriate for d/c home based on current functional status. Plan to return to progress gait training this afternoon.    Follow Up Recommendations  Follow surgeon's recommendation for DC plan and follow-up therapies;Supervision for mobility/OOB     Equipment Recommendations  Rolling walker with 5" wheels;3in1 (PT)    Recommendations for Other Services OT consult     Precautions / Restrictions Precautions Precautions: Knee Precaution Booklet Issued: Yes (comment) Precaution Comments: Reviewed knee precautions with pt.  Restrictions Weight Bearing Restrictions: Yes LLE Weight Bearing: Weight bearing as tolerated    Mobility  Bed Mobility Overal bed mobility: Needs Assistance Bed Mobility: Supine to Sit     Supine to sit: Min assist     General bed mobility comments: Min A for LLE assist throughout bed mobility. Increased time required to perfom.   Transfers Overall transfer level: Needs assistance Equipment used: Rolling walker (2 wheeled) Transfers: Sit to/from Stand Sit to Stand: From elevated surface;Min assist         General transfer  comment: Min A for lift assist and steadying from elevated surface. Verbal cues for safe hand placement. VC for hand placement and safety with AD. Requried cues for safe descent into chair by propping LLE out upon sitting and hand placement.  Ambulation/Gait Ambulation/Gait assistance: Min assist;+2 safety/equipment Gait Distance (Feet): (Limited to 3-4 steps due to nausea) Assistive device: Rolling walker (2 wheeled) Gait Pattern/deviations: Step-to pattern;Decreased step length - left;Narrow base of support;Trunk flexed;Decreased dorsiflexion - left;Decreased stance time - left;Antalgic     General Gait Details: Upon standing pt became nauseous and began dry heaving once in chair. Cues required for pursed lip breathing.  Pt required mulitmodal cues for safety with AD and proper sequencing.    Stairs             Wheelchair Mobility    Modified Rankin (Stroke Patients Only)       Balance Overall balance assessment: Needs assistance Sitting-balance support: No upper extremity supported;Feet supported Sitting balance-Leahy Scale: Good     Standing balance support: Bilateral upper extremity supported;During functional activity Standing balance-Leahy Scale: Poor Standing balance comment: Reliant on BUE support.                             Cognition Arousal/Alertness: Awake/alert Behavior During Therapy: WFL for tasks assessed/performed Overall Cognitive Status: Within Functional Limits for tasks assessed  General Comments: Pt participated in supine exercises but upon standing felt nauseous.      Exercises Total Joint Exercises Ankle Circles/Pumps: AROM;15 reps;Right;Left;Supine Quad Sets: AROM;10 reps;Left;Supine Towel Squeeze: AROM;10 reps;Left;Right;Supine Short Arc Quad: AROM;Left;5 reps;Supine;Limitations Short Arc Quad Limitations: limited by pain Heel Slides: AROM;10 reps;Left;Limitations;Supine Heel  Slides Limitations: limited by pain. cues for pursed lip breathing Hip ABduction/ADduction: AROM;10 reps;Left;Supine    General Comments        Pertinent Vitals/Pain Pain Assessment: 0-10 Pain Score: 9  Pain Location: L knee  Pain Descriptors / Indicators: Operative site guarding;Constant Pain Intervention(s): Limited activity within patient's tolerance;Monitored during session;Repositioned;Ice applied    Home Living                      Prior Function            PT Goals (current goals can now be found in the care plan section) Acute Rehab PT Goals Patient Stated Goal: to go home  PT Goal Formulation: With patient Time For Goal Achievement: 05/21/18 Potential to Achieve Goals: Good Progress towards PT goals: Progressing toward goals    Frequency    7X/week      PT Plan      Co-evaluation              AM-PAC PT "6 Clicks" Daily Activity  Outcome Measure  Difficulty turning over in bed (including adjusting bedclothes, sheets and blankets)?: A Little Difficulty moving from lying on back to sitting on the side of the bed? : A Lot Difficulty sitting down on and standing up from a chair with arms (e.g., wheelchair, bedside commode, etc,.)?: A Lot Help needed moving to and from a bed to chair (including a wheelchair)?: A Little Help needed walking in hospital room?: A Little Help needed climbing 3-5 steps with a railing? : A Lot 6 Click Score: 15    End of Session Equipment Utilized During Treatment: Gait belt Activity Tolerance: Patient limited by pain Patient left: with call bell/phone within reach;in chair Nurse Communication: Mobility status PT Visit Diagnosis: Muscle weakness (generalized) (M62.81);Unsteadiness on feet (R26.81);Other abnormalities of gait and mobility (R26.89);Pain Pain - Right/Left: Left Pain - part of body: Knee     Time: 9211-9417 PT Time Calculation (min) (ACUTE ONLY): 28 min  Charges:  $Therapeutic Exercise: 8-22  mins $Therapeutic Activity: 8-22 mins                    [address] SPTA   [place]rad 05/08/2018, 1:19 PM

## 2018-05-08 NOTE — Progress Notes (Signed)
Jerry Dodson  MRN: [id number] DOB/Age: 70/70/1947 70 y.o. Physician: Ander Slade, Jerry.D. 1 Day Post-Op Procedure(s) (LRB): LEFT TOTAL KNEE ARTHROPLASTY (Left)  Subjective: Patient reports that his pain is been very poorly controlled despite around-the-clock p.o. oxycodone and IV Dilaudid.  He reports that his symptoms became significantly worse when the CPM was applied.  Complains of nausea. Vital Signs Temp:  [97.3 F (36.3 C)-99.7 F (37.6 C)] 99.7 F (37.6 C) (11/09 0607) Pulse Rate:  [57-108] 108 (11/09 0607) Resp:  [11-18] 17 (11/09 0607) BP: (94-136)/(52-92) 129/78 (11/09 0607) SpO2:  [90 %-98 %] 90 % (11/09 0607)  Lab Results Recent Labs    05/08/18 0306  WBC 10.6*  HGB 12.9*  HCT 41.0  PLT 216   BMET Recent Labs    05/08/18 0306  NA 137  K 3.9  CL 106  CO2 25  GLUCOSE 152*  BUN 13  CREATININE 1.05  CALCIUM 8.3*   No results found for: INR   Exam  Patient is sitting at bedside, alert and oriented, however in obvious discomfort.  He maintains fair ankle dorsi and plantar flexor mobility and strength.  He is grossly neurovascular intact in left lower extremity.  Dressings are dry.  Impression  Status post left total knee arthroplasty with poor pain control  Plan I have counseled Jerry Dodson regarding today's findings.  I have spoken with the nursing staff.  We will add a dose of Toradol.  continue with his muscle relaxer as well as p.o. and IV analgesics around-the-clock.  Continue with ice application.  Hold CPM. Jerry Dodson Jerry Dodson 05/08/2018, 8:29 AM    Contact # [phone]

## 2018-05-08 NOTE — Progress Notes (Signed)
Physical Therapy Treatment Patient Details Name: Jerry Dodson MRN: [id number] DOB: [birth year] Today's Date: 05/08/2018    History of Present Illness Pt is a 72 y/o male s/p elective L TKA. PMH includes DM, HTN, and back surgery.     PT Comments    Pt in bed upon arrival. Pt was still limited by nausea from this morning and stated he is now constipated. He agreed to supine exercises. ROM of R knee flexion is -21 to 60 degrees. NOTE -- measurement may be inaccurate due to pt's noncompliance and increased pain. Pt is making slow progress toward stated goals. Due to pt's immobility and noncompliance during today's session progress may be delayed for d/c to home.    Follow Up Recommendations  Follow surgeon's recommendation for DC plan and follow-up therapies;Supervision for mobility/OOB     Equipment Recommendations  Rolling walker with 5" wheels;3in1 (PT)    Recommendations for Other Services OT consult     Precautions / Restrictions Precautions Precautions: Knee Precaution Booklet Issued: Yes (comment) Precaution Comments: Reviewed knee precautions with pt.  Restrictions Weight Bearing Restrictions: Yes LLE Weight Bearing: Weight bearing as tolerated    Mobility  Bed Mobility               General bed mobility comments: deferred  Transfers                    Ambulation/Gait                 Stairs             Wheelchair Mobility    Modified Rankin (Stroke Patients Only)       Balance                                            Cognition Arousal/Alertness: Awake/alert Behavior During Therapy: WFL for tasks assessed/performed Overall Cognitive Status: Within Functional Limits for tasks assessed                                 General Comments: Pt limited this session due to continued nausea.      Exercises Total Joint Exercises Ankle Circles/Pumps: AROM;15 reps;Left;Supine;Right Quad Sets:  AROM;10 reps;Left;Supine Heel Slides: AROM;10 reps;Left;Supine;Limitations Heel Slides Limitations: limited by pain Goniometric ROM: L Knee flexion -21degrees to 60 degrees. NOTE: measurment may be inaccurate due to pt non compliance and increased pain.    General Comments        Pertinent Vitals/Pain Pain Assessment: Faces Pain Score: 7  Faces Pain Scale: Hurts even more Pain Location: L knee  Pain Descriptors / Indicators: Operative site guarding;Constant Pain Intervention(s): Limited activity within patient's tolerance;Monitored during session    Home Living                      Prior Function            PT Goals (current goals can now be found in the care plan section) Acute Rehab PT Goals Patient Stated Goal: to go home  PT Goal Formulation: With patient Time For Goal Achievement: 05/21/18 Potential to Achieve Goals: Good Progress towards PT goals: Not progressing toward goals - comment(due to continued nausea and immobility)    Frequency    7X/week      PT  Plan      Co-evaluation              AM-PAC PT "6 Clicks" Daily Activity  Outcome Measure  Difficulty turning over in bed (including adjusting bedclothes, sheets and blankets)?: A Little Difficulty moving from lying on back to sitting on the side of the bed? : A Lot Difficulty sitting down on and standing up from a chair with arms (e.g., wheelchair, bedside commode, etc,.)?: A Lot Help needed moving to and from a bed to chair (including a wheelchair)?: A Little Help needed walking in hospital room?: A Little Help needed climbing 3-5 steps with a railing? : A Lot 6 Click Score: 15    End of Session Equipment Utilized During Treatment: Gait belt Activity Tolerance: Patient limited by pain Patient left: in bed;with call bell/phone within reach Nurse Communication: Mobility status PT Visit Diagnosis: Muscle weakness (generalized) (M62.81);Unsteadiness on feet (R26.81);Other abnormalities  of gait and mobility (R26.89);Pain Pain - Right/Left: Left Pain - part of body: Knee     Time: 1444-1459 PT Time Calculation (min) (ACUTE ONLY): 15 min  Charges:  $Therapeutic Exercise: 8-22 mins                     [place], SPTA   [place] 05/08/2018, 3:36 PM

## 2018-05-09 LAB — GLUCOSE, CAPILLARY
GLUCOSE-CAPILLARY: 152 mg/dL — AB (ref 70–99)
GLUCOSE-CAPILLARY: 110 mg/dL — AB (ref 70–99)
Glucose-Capillary: 171 mg/dL — ABNORMAL HIGH (ref 70–99)
Glucose-Capillary: 131 mg/dL — ABNORMAL HIGH (ref 70–99)

## 2018-05-09 LAB — CBC
HEMATOCRIT: 36.5 % — AB (ref 39.0–52.0)
HEMOGLOBIN: 12.1 g/dL — AB (ref 13.0–17.0)
MCH: 29.7 pg (ref 26.0–34.0)
MCHC: 33.2 g/dL (ref 30.0–36.0)
MCV: 89.5 fL (ref 80.0–100.0)
PLATELETS: 139 10*3/uL — AB (ref 150–400)
RBC: 4.08 MIL/uL — AB (ref 4.22–5.81)
RDW: 12.5 % (ref 11.5–15.5)
WBC: 11 10*3/uL — ABNORMAL HIGH (ref 4.0–10.5)
nRBC: 0 % (ref 0.0–0.2)

## 2018-05-09 NOTE — Evaluation (Signed)
Occupational Therapy Evaluation Patient Details Name: Jerry Dodson MRN: [id number] DOB: [birth year] Today's Date: 05/09/2018    History of Present Illness Pt is a 72 y/o male s/p elective L TKA. PMH includes DM, HTN, and back surgery.    Clinical Impression   PTA, pt was living with his wife and was independent. Currently, pt requires Supervision-Min Guard A for ADLs and functional mobility using RW. Provided education on LB ADLs, toilet transfer, and shower transfer with 3N1; pt demonstrated understanding. Answered all pt questions. Recommend dc home once medically stable per physician. All acute OT needs met and will sign off. Thank you.     Follow Up Recommendations  Follow surgeon's recommendation for DC plan and follow-up therapies;Supervision/Assistance - 24 hour    Equipment Recommendations  3 in 1 bedside commode    Recommendations for Other Services       Precautions / Restrictions Precautions Precautions: Knee Precaution Comments: Reviewed knee precautions with pt.  Restrictions Weight Bearing Restrictions: Yes LLE Weight Bearing: Weight bearing as tolerated      Mobility Bed Mobility               General bed mobility comments: In recliner upon arrival  Transfers Overall transfer level: Needs assistance Equipment used: Rolling walker (2 wheeled) Transfers: Sit to/from Stand Sit to Stand: Min guard         General transfer comment: Min Guard A for safety. Pt with good hand placement and weight shift    Balance Overall balance assessment: Needs assistance Sitting-balance support: No upper extremity supported;Feet supported Sitting balance-Leahy Scale: Good     Standing balance support: Bilateral upper extremity supported;During functional activity Standing balance-Leahy Scale: Fair Standing balance comment: Able to maintain standing balance during LB dressing without UE support                           ADL either performed or  assessed with clinical judgement   ADL Overall ADL's : Needs assistance/impaired                                       General ADL Comments: Pt performing ADLs and functional mobility at supervision-Min GUard A level. Provided education on LB ADLs, toilet transfers, and shower transfer with 3N1. Pt demonstrating understanding.      Vision         Perception     Praxis      Pertinent Vitals/Pain Pain Assessment: Faces Faces Pain Scale: Hurts little more Pain Location: L knee  Pain Descriptors / Indicators: Operative site guarding;Constant Pain Intervention(s): Monitored during session;Limited activity within patient's tolerance;Repositioned     Hand Dominance Right   Extremity/Trunk Assessment Upper Extremity Assessment Upper Extremity Assessment: Overall WFL for tasks assessed   Lower Extremity Assessment Lower Extremity Assessment: Defer to PT evaluation LLE Deficits / Details: Deficits consistent with post op pain and weakness. Able to perform ther ex below.    Cervical / Trunk Assessment Cervical / Trunk Assessment: Normal   Communication Communication Communication: No difficulties   Cognition Arousal/Alertness: Awake/alert Behavior During Therapy: WFL for tasks assessed/performed Overall Cognitive Status: Within Functional Limits for tasks assessed                                 General Comments: Pt limited  this session due to continued nausea.   General Comments       Exercises     Shoulder Instructions      Home Living Family/patient expects to be discharged to:: Private residence Living Arrangements: Spouse/significant other Available Help at Discharge: Family;Available 24 hours/day Type of Home: House Home Access: Stairs to enter [employer] of Steps: 3 Entrance Stairs-Rails: None Home Layout: One level     Bathroom Shower/Tub: Occupational psychologist: Handicapped height     Home  Equipment: None          Prior Functioning/Environment Level of Independence: Independent                 OT Problem List: Decreased strength;Decreased activity tolerance;Impaired balance (sitting and/or standing);Decreased knowledge of use of DME or AE;Decreased knowledge of precautions;Pain      OT Treatment/Interventions:      OT Goals(Current goals can be found in the care plan section) Acute Rehab OT Goals Patient Stated Goal: to go home  OT Goal Formulation: All assessment and education complete, DC therapy  OT Frequency:     Barriers to D/C:            Co-evaluation              AM-PAC PT "6 Clicks" Daily Activity     Outcome Measure Help from another person eating meals?: None Help from another person taking care of personal grooming?: None Help from another person toileting, which includes using toliet, bedpan, or urinal?: A Little Help from another person bathing (including washing, rinsing, drying)?: A Little Help from another person to put on and taking off regular upper body clothing?: None Help from another person to put on and taking off regular lower body clothing?: A Little 6 Click Score: 21   End of Session Equipment Utilized During Treatment: Gait belt;Rolling walker Nurse Communication: Mobility status  Activity Tolerance: Patient tolerated treatment well Patient left: in chair;Other (comment);with nursing/sitter in room(At sink for bathing)  OT Visit Diagnosis: Unsteadiness on feet (R26.81);Other abnormalities of gait and mobility (R26.89);Muscle weakness (generalized) (M62.81);Pain Pain - Right/Left: Left Pain - part of body: Knee                Time: 1438-8875 OT Time Calculation (min): 18 min Charges:  OT General Charges $OT Visit: 1 Visit OT Evaluation $OT Eval Low Complexity: 1 Low  Jacory Kamel MSOT, OTR/L Acute Rehab Pager: [phone] Office: [place] 05/09/2018, 10:02 AM

## 2018-05-09 NOTE — Progress Notes (Signed)
Pt educated on use and need for using the CPM and the bone foam. Pt stated "The doctor told me when I was in PACU I didn't have to use it and it hurts so I'm not going to use it". Pt educated on purpose and need for the CPM and the bone foam again. Pt again refused to use either. Pt insisted on wearing the immobilizer as well stating that "the MD told me I needed to wear it in bed". Pt provided with paper illustrating exercises and asked by RN to complete them. Pt refused stating he did not want to right now. PT educated on need for exercises. Pt verbalizes understanding.

## 2018-05-09 NOTE — Progress Notes (Addendum)
   Subjective: 2 Days Post-Op Procedure(s) (LRB): LEFT TOTAL KNEE ARTHROPLASTY (Left) Patient reports pain as moderate.   Patient seen in rounds for Dr. Veverly Fells. Patient is well, and has had no acute complaints or problems other than discomfort in the left knee. He reports that he is feeling better today. He is eager to work with therapy. Voiding well and reports flatus. No SOB or chest pain.  Objective: Vital signs in last 24 hours: Temp:  [98.5 F (36.9 C)-99.2 F (37.3 C)] 99.2 F (37.3 C) (11/10 0646) Pulse Rate:  [88-103] 103 (11/10 0646) Resp:  [18-20] 18 (11/10 0646) BP: (111-125)/(76-82) 111/76 (11/10 0646) SpO2:  [94 %-97 %] 96 % (11/10 0646)  Intake/Output from previous day:  Intake/Output Summary (Last 24 hours) at 05/09/2018 0828 Last data filed at 05/09/2018 0655 Gross per 24 hour  Intake 360 ml  Output 200 ml  Net 160 ml     Labs: Recent Labs    05/08/18 0306 05/09/18 0338  HGB 12.9* 12.1*   Recent Labs    05/08/18 0306 05/09/18 0338  WBC 10.6* 11.0*  RBC 4.56 4.08*  HCT 41.0 36.5*  PLT 216 139*   Recent Labs    05/08/18 0306  NA 137  K 3.9  CL 106  CO2 25  BUN 13  CREATININE 1.05  GLUCOSE 152*  CALCIUM 8.3*    EXAM General - Patient is Alert and Oriented Extremity - Neurologically intact Intact pulses distally Dorsiflexion/Plantar flexion intact No cellulitis present Compartment soft Dressing/Incision - clean, dry, no drainage Motor Function - intact, moving foot and toes well on exam.   Past Medical History:  Diagnosis Date  . Anemia    "was told blood didn't have enough iron in 1971"  . Arthritis   . Diabetes mellitus without complication ([place])   . Family history of adverse reaction to anesthesia    Patient states sister was "given too much anesthesia and now has nerve damage"  . Headache   . History of kidney stones 2000  . Hypertension   . Pneumonia    x 2 at age 24 and in 58  . PONV (postoperative nausea and  vomiting)     Assessment/Plan: 2 Days Post-Op Procedure(s) (LRB): LEFT TOTAL KNEE ARTHROPLASTY (Left) Active Problems:   H/O total knee replacement, left  Estimated body mass index is 28.79 kg/m as calculated from the following:   Height as of this encounter: 6' (1.829 m).   Weight as of this encounter: 96.3 kg. Advance diet Up with therapy Plan for discharge tomorrow  DVT Prophylaxis - Aspirin Weight-Bearing as tolerated  Hold DC until tomorrow. Patient needs more therapy today as he has been limited due to pain.  Ardeen Jourdain, PA-C Orthopaedic Surgery 05/09/2018, 8:28 AM   Agree with the above assessment and plan.  Netta Cedars MD

## 2018-05-09 NOTE — Progress Notes (Signed)
Physical Therapy Treatment Patient Details Name: Jerry Dodson MRN: [id number] DOB: 1945/07/13 Today's Date: 05/09/2018    History of Present Illness Pt is a 72 y/o male s/p elective L TKA. PMH includes DM, HTN, and back surgery.     PT Comments    Continuing work on functional mobility and activity tolerance;  Noting very good progress with activity tolerance and progressive amb; Nice, stable knee in stance -- will plan to work without [place] in pm session; on track for [employer] home tomorrow   Follow Up Recommendations  Follow surgeon's recommendation for DC plan and follow-up therapies;Supervision for mobility/OOB     Equipment Recommendations  Rolling walker with 5" wheels;3in1 (PT)    Recommendations for Other Services       Precautions / Restrictions Precautions Precautions: Knee Precaution Comments: Reviewed knee precautions with pt.  Restrictions Weight Bearing Restrictions: Yes LLE Weight Bearing: Weight bearing as tolerated  Pt educated to not allow any pillow or bolster under knee for healing with optimal range of motion.    Mobility  Bed Mobility Overal bed mobility: Needs Assistance Bed Mobility: Supine to Sit     Supine to sit: Min guard(without physical contact)     General bed mobility comments: no physical assist needed; cued to refrain from using bedrail to approximate home  Transfers Overall transfer level: Needs assistance Equipment used: Rolling walker (2 wheeled) Transfers: Sit to/from Stand Sit to Stand: Min guard         General transfer comment: Min Guard A for safety. Pt with good hand placement and weight shift  Ambulation/Gait Ambulation/Gait assistance: Min guard Gait Distance (Feet): 100 Feet Assistive device: Rolling walker (2 wheeled) Gait Pattern/deviations: Step-through pattern(emerging) Gait velocity: Decreased    General Gait Details: Cues to activate L quad for stance stability; Good self-monitor for activity tolerance; used KI  for progressive amb; probably can go without next session   Stairs Stairs: Yes Stairs assistance: Min assist Stair Management: No rails;With walker;Backwards;Step to pattern Number of Stairs: 3 General stair comments: Step by step cues for technqiue   Wheelchair Mobility    Modified Rankin (Stroke Patients Only)       Balance Overall balance assessment: Needs assistance Sitting-balance support: No upper extremity supported;Feet supported Sitting balance-Leahy Scale: Good     Standing balance support: Bilateral upper extremity supported;During functional activity Standing balance-Leahy Scale: Fair Standing balance comment: Able to maintain standing balance during LB dressing without UE support                            Cognition Arousal/Alertness: Awake/alert Behavior During Therapy: WFL for tasks assessed/performed Overall Cognitive Status: Within Functional Limits for tasks assessed                                 General Comments: Pt limited this session due to continued nausea.      Exercises Total Joint Exercises Ankle Circles/Pumps: AROM;15 reps;Left;Supine;Right Quad Sets: AROM;10 reps;Left;Supine Straight Leg Raises: AROM;AAROM;10 reps;Left    General Comments        Pertinent Vitals/Pain Pain Assessment: 0-10 Pain Score: 6  Faces Pain Scale: Hurts little more Pain Location: L knee  Pain Descriptors / Indicators: Operative site guarding;Constant Pain Intervention(s): Monitored during session    Home Living Family/patient expects to be discharged to:: Private residence Living Arrangements: Spouse/significant other Available Help at Discharge: Family;Available 24 hours/day  Type of Home: House Home Access: Stairs to enter Entrance Stairs-Rails: None Home Layout: One level Home Equipment: None      Prior Function Level of Independence: Independent          PT Goals (current goals can now be found in the care plan  section) Acute Rehab PT Goals Patient Stated Goal: to go home  PT Goal Formulation: With patient Time For Goal Achievement: 05/21/18 Potential to Achieve Goals: Good Progress towards PT goals: Progressing toward goals    Frequency    7X/week      PT Plan Current plan remains appropriate    Co-evaluation              AM-PAC PT "6 Clicks" Daily Activity  Outcome Measure  Difficulty turning over in bed (including adjusting bedclothes, sheets and blankets)?: A Little Difficulty moving from lying on back to sitting on the side of the bed? : A Little Difficulty sitting down on and standing up from a chair with arms (e.g., wheelchair, bedside commode, etc,.)?: A Little Help needed moving to and from a bed to chair (including a wheelchair)?: A Little Help needed walking in hospital room?: A Little Help needed climbing 3-5 steps with a railing? : A Little 6 Click Score: 18    End of Session Equipment Utilized During Treatment: Gait belt;Left knee immobilizer Activity Tolerance: Patient tolerated treatment well Patient left: in chair;with call bell/phone within reach Nurse Communication: Mobility status PT Visit Diagnosis: Muscle weakness (generalized) (M62.81);Unsteadiness on feet (R26.81);Other abnormalities of gait and mobility (R26.89);Pain Pain - Right/Left: Left Pain - part of body: Knee     Time: 0826-0900 PT Time Calculation (min) (ACUTE ONLY): 34 min  Charges:  $Gait Training: 23-37 mins                     Roney Marion, [place]  [place] Pager [phone] Office [place] 05/09/2018, 11:59 AM

## 2018-05-10 ENCOUNTER — Encounter (HOSPITAL_COMMUNITY): Payer: Self-pay | Admitting: Orthopedic Surgery

## 2018-05-10 LAB — CBC
HEMATOCRIT: 34.6 % — AB (ref 39.0–52.0)
HEMOGLOBIN: 11 g/dL — AB (ref 13.0–17.0)
MCH: 28.4 pg (ref 26.0–34.0)
MCHC: 31.8 g/dL (ref 30.0–36.0)
MCV: 89.4 fL (ref 80.0–100.0)
NRBC: 0 % (ref 0.0–0.2)
Platelets: 139 10*3/uL — ABNORMAL LOW (ref 150–400)
RBC: 3.87 MIL/uL — AB (ref 4.22–5.81)
RDW: 12.3 % (ref 11.5–15.5)
WBC: 10.3 10*3/uL (ref 4.0–10.5)

## 2018-05-10 LAB — GLUCOSE, CAPILLARY
GLUCOSE-CAPILLARY: 165 mg/dL — AB (ref 70–99)
GLUCOSE-CAPILLARY: 126 mg/dL — AB (ref 70–99)

## 2018-05-10 MED ORDER — ONDANSETRON HCL 4 MG PO TABS: 4.0000 mg | ORAL_TABLET | Freq: Three times a day (TID) | ORAL | 0 refills | Status: DC | PRN

## 2018-05-10 MED ORDER — BISACODYL 10 MG RE SUPP
10.0000 mg | Freq: Once | RECTAL | Status: AC
  Administered 2018-05-10: 10 mg via RECTAL

## 2018-05-10 NOTE — Progress Notes (Signed)
Patient declined to wear CPM machine, SCDs and bone foam. Patient stated "Dr informed him that these devices does not benefit him" Patient has been educated on the importance of using these. Patient did agree to walk in the hall and completed a lap around half the unit.

## 2018-05-10 NOTE — Discharge Summary (Signed)
Orthopedic Discharge Summary        Physician Discharge Summary  Patient ID: Jerry Dodson MRN: [id number] DOB/AGE: 1945-08-25 72 y.o.  Admit date: 05/07/2018 Discharge date: 05/10/2018   Procedures:  Procedure(s) (LRB): LEFT TOTAL KNEE ARTHROPLASTY (Left)  Attending Physician:  Dr. Esmond Plants  Admission Diagnoses:   Left knee end stage OA  Discharge Diagnoses:  Left knee end stage OA   Past Medical History:  Diagnosis Date  . Anemia    "was told blood didn't have enough iron in 1971"  . Arthritis   . Diabetes mellitus without complication ([place])   . Family history of adverse reaction to anesthesia    Patient states sister was "given too much anesthesia and now has nerve damage"  . Headache   . History of kidney stones 2000  . Hypertension   . Pneumonia    x 2 at age 39 and in 60  . PONV (postoperative nausea and vomiting)     PCP: Curlene Labrum, MD   Discharged Condition: good  Hospital Course:  Patient underwent the above stated procedure on 05/07/2018. Patient tolerated the procedure well and brought to the recovery room in good condition and subsequently to the floor. Patient had an uncomplicated hospital course and was stable for discharge.   Disposition: Discharge disposition: 01-Home or Self Care      with follow up in 2 weeks   Follow-up Information    Netta Cedars, MD. Schedule an appointment as soon as possible for a visit in 2 week(s).   Specialty:  Orthopedic Surgery Why:  Please contact our office to schedule your post-op appointment Contact information: [address] [place] [address] [phone]        Therapy, [facility] Follow up on 05/11/2018.   Specialty:  Physical Therapy Why:  Please report to [facility] in [place] ([facility] for your Physical Therapy Evaluation on 05-11-18 at 1:30 pm. Contact information: [place] [address] [phone]        Netta Cedars, MD. Call in 2 weeks.   Specialty:  Orthopedic Surgery Why:  [phone] Contact information: [address] [place] [address] [phone]           Discharge Instructions    Call MD / Call 911   Complete by:  As directed    If you experience chest pain or shortness of breath, CALL 911 and be transported to the hospital emergency room.  If you develope a fever above 101 F, pus (white drainage) or increased drainage or redness at the wound, or calf pain, call your surgeon's office.   Constipation Prevention   Complete by:  As directed    Drink plenty of fluids.  Prune juice may be helpful.  You may use a stool softener, such as Colace (over the counter) 100 mg twice a day.  Use MiraLax (over the counter) for constipation as needed.   Diet - low sodium heart healthy   Complete by:  As directed    Driving restrictions   Complete by:  As directed    No driving for 2weeks   Increase activity slowly as tolerated   Complete by:  As directed       Allergies as of 05/10/2018   No Known Allergies     Medication List    TAKE these medications   aspirin 81 MG chewable tablet Chew 1 tablet (81 mg total) by mouth 2 (two) times  daily.   atorvastatin 10 MG tablet Commonly known as:  LIPITOR Take 10 mg by mouth every morning.   lisinopril 2.5 MG tablet Commonly known as:  PRINIVIL,ZESTRIL Take 2.5 mg by mouth every morning.   metFORMIN 500 MG tablet Commonly known as:  GLUCOPHAGE Take 500 mg by mouth 2 (two) times daily with a meal.   methocarbamol 500 MG tablet Commonly known as:  ROBAXIN Take 1 tablet (500 mg total) by mouth 3 (three) times daily as needed.   ondansetron 4 MG tablet Commonly known as:  ZOFRAN Take 1 tablet (4 mg total) by mouth every 8 (eight) hours as needed for nausea or vomiting.   oxyCODONE-acetaminophen 5-325 MG tablet Commonly known as:  PERCOCET/ROXICET Take 1-2 tablets by mouth every 4 (four) hours as needed  for severe pain.         Signed: Augustin Schooling 05/10/2018, 9:47 AM  [facility] is now [facility] [address] [place], [place], [place] [address] Phone: [place]

## 2018-05-10 NOTE — Progress Notes (Signed)
Pt noted resting in bed. Stated he was nauseated. Ate good for lunch but declined insulin d/t nausea. Pt was medicated for nausea and pain. Stable condition. Ready for discharge home. DME is in room and ready to go with him to his house.

## 2018-05-10 NOTE — Care Management Note (Addendum)
Case Management Note  Patient Details  Name: Jerry Dodson MRN: [id number] Date of Birth: 12-28-1945  Subjective/Objective:  72yo male presented for elective L TKA.                  Action/Plan: CM met with patient/spouse to discuss transitional needs and therapy recommendations. Patient lived at home with spouse PTA, independent with no DME in use. Per primary nurse, initial plan was outpatient PT, but d/t patient's increased nausea, will need HHPT. PCP verified as: Dr. Judd Lien. HH/DME preference lists provided, with no preference for HH/DME. [place] referral given to [place], [facility] liaison; DME referral given to Jeneen Rinks [facility] liaison; AVS updated. Patient's spouse will provide assistance with ADLs and transportation home. BSC/RW will be delivered to patient's room prior to transitioning home.   Expected Discharge Date:  05/10/18               Expected Discharge Plan:  Home/Self Care  In-House Referral:  NA  Discharge planning Services  CM Consult  Post Acute Care Choice:  Durable Medical Equipment, Home Health Choice offered to:  Spouse, Patient  DME Arranged:  [facility] rolling DME Agency:  [employer]  HH Arranged:  NA HH Agency:  NA  Status of Service:  Completed, signed off  If discussed at [place] of Stay Meetings, dates discussed:    Additional Comments: 05/10/18 @ 1226-Astou Lada RNCM-CM discussed the POC with Dr. Veverly Fells; MD wants patient to transition home with outpatient PT, as initially discussed and arranged, and not HHPT. CM updated Dan RN, [facility] liaison and patient/spouse of the conversation with Dr. Veverly Fells.   Midge Minium RN, BSN, NCM-BC, ACM-RN [phone] 05/10/2018, 11:28 AM

## 2018-05-10 NOTE — Progress Notes (Signed)
Orthopedics Progress Note  Subjective: Patient has not had BM, looking forward to going home  Objective:  Vitals:   05/09/18 2136 05/10/18 0448  BP: 114/89 122/76  Pulse: (!) 104 93  Resp: 16 17  Temp:  98.4 F (36.9 C)  SpO2: 95% 92%    General: Awake and alert  Musculoskeletal: Left knee dressing changed. Compartments supple, Neg Homan's test, knee ROM 10-80 Neurovascularly intact  Lab Results  Component Value Date   WBC 10.3 05/10/2018   HGB 11.0 (L) 05/10/2018   HCT 34.6 (L) 05/10/2018   MCV 89.4 05/10/2018   PLT 139 (L) 05/10/2018       Component Value Date/Time   NA 137 05/08/2018 0306   K 3.9 05/08/2018 0306   CL 106 05/08/2018 0306   CO2 25 05/08/2018 0306   GLUCOSE 152 (H) 05/08/2018 0306   BUN 13 05/08/2018 0306   CREATININE 1.05 05/08/2018 0306   CALCIUM 8.3 (L) 05/08/2018 0306   GFRNONAA >60 05/08/2018 0306   GFRAA >60 05/08/2018 0306    No results found for: INR, PROTIME  Assessment/Plan: POD #3 s/p Procedure(s): LEFT TOTAL KNEE ARTHROPLASTY Discharge to home with outpatient therapy starting tomorrow WBAT on the left leg, needs to have a BM before he leaves  Jerry Lipps R. Veverly Fells, MD 05/10/2018 9:43 AM

## 2018-05-10 NOTE — Progress Notes (Signed)
Physical Therapy Treatment Note (Late entry for session completed yesterday afternoon)  Clinical Impression: Continuing work on functional mobility and activity tolerance;  Noting improvements in A/P/ROM; Still quite nauseated with getting up and moving; He and his wife expressed concern with making it to Outpt PT appts this week; I told them I would message Dr. Veverly Fells; On track for dc home 11/11    05/09/18 1500  PT Visit Information  Last PT Received On 05/09/18  Assistance Needed +1  History of Present Illness Pt is a 72 y/o male s/p elective L TKA. PMH includes DM, HTN, and back surgery.   Subjective Data  Patient Stated Goal to go home   Precautions  Precautions Knee  Precaution Comments Reviewed knee precautions with pt.   Restrictions  LLE Weight Bearing WBAT  Pain Assessment  Pain Assessment 0-10  Pain Score 5  Pain Location L knee   Pain Descriptors / Indicators Operative site guarding;Constant  Pain Intervention(s) Monitored during session  Cognition  Arousal/Alertness Awake/alert  Behavior During Therapy WFL for tasks assessed/performed  Overall Cognitive Status Within Functional Limits for tasks assessed  Bed Mobility  Overal bed mobility Needs Assistance  Bed Mobility Supine to Sit  Supine to sit Supervision  General bed mobility comments no physical assist needed; cued to refrain from using bedrail to approximate home  Transfers  Overall transfer level Needs assistance  Equipment used Rolling walker (2 wheeled)  Transfers Sit to/from Stand  Sit to Stand Min guard  General transfer comment Min Guard A for safety. Pt with good hand placement and weight shift  Ambulation/Gait  Ambulation/Gait assistance Min guard  Gait Distance (Feet) 150 Feet (with one seated rest break)  Assistive device Rolling walker (2 wheeled)  Gait Pattern/deviations Step-through pattern (emerging)  General Gait Details Cues to activate L quad for stance stability; Good self-monitor  for activity tolerance, with one seated and one standing rest break  Gait velocity Decreased   Balance  Sitting balance-Leahy Scale Good  Standing balance-Leahy Scale Fair  Exercises  Exercises Total Joint  Total Joint Exercises  Ankle Circles/Pumps AROM;15 reps;Left;Supine;Right  Quad Sets AROM;10 reps;Left;Supine  Short Arc [employer];Left;10 reps  Heel Slides AROM;Left;10 reps  Goniometric ROM approx 5-85 deg  Straight Leg Raises AROM;AAROM;10 reps;Left  PT - End of Session  Equipment Utilized During Treatment Gait belt  Activity Tolerance Patient tolerated treatment well  Patient left in chair;with call bell/phone within reach  Nurse Communication Mobility status   PT - Assessment/Plan  PT Plan Current plan remains appropriate  PT Visit Diagnosis Muscle weakness (generalized) (M62.81);Unsteadiness on feet (R26.81);Other abnormalities of gait and mobility (R26.89);Pain  Pain - Right/Left Left  Pain - part of body Knee  PT Frequency (ACUTE ONLY) 7X/week  Follow Up Recommendations Follow surgeon's recommendation for DC plan and follow-up therapies;Supervision for mobility/OOB (Pt and wife are concerned about making it to Outpt PT this week)  PT equipment Rolling walker with 5" wheels;3in1 (PT)  AM-PAC PT "6 Clicks" Daily Activity Outcome Measure  Difficulty turning over in bed (including adjusting bedclothes, sheets and blankets)? 3  Difficulty moving from lying on back to sitting on the side of the bed?  3  Difficulty sitting down on and standing up from a chair with arms (e.g., wheelchair, bedside commode, etc,.)? 3  Help needed moving to and from a bed to chair (including a wheelchair)? 3  Help needed walking in hospital room? 3  Help needed climbing 3-5 steps with a railing?  3  6 Click Score 18  Mobility G Code  CK  PT Goal Progression  Progress towards PT goals Progressing toward goals  Acute Rehab PT Goals  PT Goal Formulation With patient  Time For Goal Achievement  05/21/18  Potential to Achieve Goals Good  PT Time Calculation  PT Start Time (ACUTE ONLY) 1452  PT Stop Time (ACUTE ONLY) 1534  PT Time Calculation (min) (ACUTE ONLY) 42 min  PT General Charges  $$ ACUTE PT VISIT 1 Visit  PT Treatments  $Gait Training 23-37 mins  $Therapeutic Exercise 8-22 mins   Roney Marion, PT  [place] Pager [phone] Office [phone]

## 2018-05-10 NOTE — Progress Notes (Signed)
Pt c/o IV site hurting, as well as nausea. Medicated PO for nausea, IV site removed. Pressure held to site x1 min. Called back in room 5 minutes later to discover that pt was bleeding from where IV site was removed. Blood on chair, running down arm. Removed bandage, was clotted at this time. Cleansed arm and hand, as well as chair. New bandage applied. Continue to monitor.

## 2018-05-10 NOTE — Progress Notes (Addendum)
In to see pt, informed pt I had still not heard any new about whether the doctor had changed his mind regarding him taking therapy at home. Pt stated that "it's ok, I'm going home". I let pt and wife know that They should plan on keeping their outpatient therapy appt tomorrow unless they hear from the MD office. Pt escorted to private vehicle by staff in wheelchair. No acute distress. Pt in stable condition. All personal belongings and discharge instructions taken with pt. Pt and wife verbalized understanding of teaching.

## 2018-05-10 NOTE — Progress Notes (Signed)
Physical Therapy Treatment Patient Details Name: Jerry Dodson MRN: [id number] DOB: August 28, 1945 Today's Date: 05/10/2018    History of Present Illness Pt is a 72 y/o male s/p elective L TKA. PMH includes DM, HTN, and back surgery. Recovery has been complicated by nausea/vomiting, and difficulty getting pain under control    PT Comments    Continuing work on functional mobility and activity tolerance;  More nauseated this am, with less tolerance of activity and progressive amb;  (pt is wondering if that is related to lack of BM, and that stands to reason);  Practiced stairs with pt and wife, and Ms. Harten provided correct assist; OK for dc home from PT standpoint   Pt and wife are concerned about making it to Outpt PT appointments this week, because he is experiencing such nausea; Their concerns are understandable, and I messaged Dr. Veverly Fells with their concerns; When I noted that our Case Mgr set them up with HHPT, I contacted Dr. Veverly Fells' office, and Sharee Pimple from [facility] office will be gettign in touch with our Case Mgr -- I contacted Hilda Blades, RN and asked that we get follow up services clarified before [place] discharges  Follow Up Recommendations  Follow surgeon's recommendation for DC plan and follow-up therapies;Supervision for mobility/OOB(see above)     Equipment Recommendations  Rolling walker with 5" wheels;3in1 (PT)    Recommendations for Other Services       Precautions / Restrictions Precautions Precautions: Knee Precaution Comments: Pt educated to not allow any pillow or bolster under knee for healing with optimal range of motion.  Restrictions LLE Weight Bearing: Weight bearing as tolerated    Mobility  Bed Mobility Overal bed mobility: Needs Assistance Bed Mobility: Supine to Sit     Supine to sit: Supervision     General bed mobility comments: moving well  Transfers Overall transfer level: Needs assistance Equipment used: Rolling walker (2 wheeled) Transfers:  Sit to/from Stand Sit to Stand: Supervision         General transfer comment: Good rise and hand placement  Ambulation/Gait Ambulation/Gait assistance: Supervision Gait Distance (Feet): 20 Feet(amb in room and in gym; limited by nausea) Assistive device: Rolling walker (2 wheeled) Gait Pattern/deviations: Step-through pattern(emerging)     General Gait Details: Cues to activate L quad for stance stability; Good self-monitor for activity tolerance   Stairs Stairs: Yes Stairs assistance: Min assist Stair Management: No rails;With walker;Backwards;Step to pattern Number of Stairs: 3 General stair comments: Step by step cues for technqiue; Demonstrated technique for wife, and she provided correct assist   Wheelchair Mobility    Modified Rankin (Stroke Patients Only)       Balance     Sitting balance-Leahy Scale: Good       Standing balance-Leahy Scale: Fair                              Cognition Arousal/Alertness: Awake/alert Behavior During Therapy: WFL for tasks assessed/performed;Flat affect(Nauseated) Overall Cognitive Status: Within Functional Limits for tasks assessed                                        Exercises      General Comments General comments (skin integrity, edema, etc.): Very nauseated today      Pertinent Vitals/Pain Pain Assessment: Faces Faces Pain Scale: Hurts even more Pain Location: L knee  Pain Descriptors / Indicators: Operative site guarding;Constant Pain Intervention(s): Monitored during session;Premedicated before session    Home Living                      Prior Function            PT Goals (current goals can now be found in the care plan section) Acute Rehab PT Goals Patient Stated Goal: to go home  PT Goal Formulation: With patient Time For Goal Achievement: 05/21/18 Potential to Achieve Goals: Good Progress towards PT goals: Progressing toward goals    Frequency     7X/week      PT Plan Current plan remains appropriate    Co-evaluation              AM-PAC PT "6 Clicks" Daily Activity  Outcome Measure  Difficulty turning over in bed (including adjusting bedclothes, sheets and blankets)?: A Little Difficulty moving from lying on back to sitting on the side of the bed? : A Little Difficulty sitting down on and standing up from a chair with arms (e.g., wheelchair, bedside commode, etc,.)?: A Little Help needed moving to and from a bed to chair (including a wheelchair)?: A Little Help needed walking in hospital room?: A Little Help needed climbing 3-5 steps with a railing? : A Little 6 Click Score: 18    End of Session Equipment Utilized During Treatment: Gait belt Activity Tolerance: Patient tolerated treatment well Patient left: in chair;with call bell/phone within reach Nurse Communication: Mobility status PT Visit Diagnosis: Muscle weakness (generalized) (M62.81);Unsteadiness on feet (R26.81);Other abnormalities of gait and mobility (R26.89);Pain Pain - Right/Left: Left Pain - part of body: Knee     Time: 9833-8250 PT Time Calculation (min) (ACUTE ONLY): 33 min  Charges:  $Gait Training: 8-22 mins $Therapeutic Activity: 8-22 mins                     Roney Marion, PT  Acute Rehabilitation Services Pager [phone] Office [place] 05/10/2018, 1:43 PM

## 2018-05-10 NOTE — Progress Notes (Signed)
Nurse contacted CSW with concerns of patient discharging without home health and requested assistance in contacting [facility].   CSW contacted Sharee Pimple who is ortho bundle RNCM. Sharee Pimple reported that plan was outpatien therapy with no home health and this was communicated with Lanelle Bal floor RNCM as well as [place] encouraged Sharee Pimple to contact nurse to ensure family and staff are on same page regarding discharge today.   Please contact Sharee Pimple with any future questions regarding patient and discharge plan at [phone].  CSW signing off.   Aspen Donn Wilmot, [place]

## 2018-05-10 NOTE — Progress Notes (Signed)
Pt alert, able to make needs known. C/o pain in left knee. Medicated as needed. Pt noted to have limited ROM of L knee. Pt set to discharge home today with home health and wife. Discussed the need for pt to straighten surgical leg. Applied bone foam. Pt tolerated poorly, but left in place for 10 minutes. In therapy at this time. Continue to monitor.

## 2018-05-11 DIAGNOSIS — R2689 Other abnormalities of gait and mobility: Secondary | ICD-10-CM | POA: Diagnosis not present

## 2018-05-11 DIAGNOSIS — Z96652 Presence of left artificial knee joint: Secondary | ICD-10-CM | POA: Diagnosis not present

## 2018-05-11 DIAGNOSIS — M25662 Stiffness of left knee, not elsewhere classified: Secondary | ICD-10-CM | POA: Diagnosis not present

## 2018-05-11 DIAGNOSIS — M6281 Muscle weakness (generalized): Secondary | ICD-10-CM | POA: Diagnosis not present

## 2018-05-11 DIAGNOSIS — Z471 Aftercare following joint replacement surgery: Secondary | ICD-10-CM | POA: Diagnosis not present

## 2018-05-12 DIAGNOSIS — M6281 Muscle weakness (generalized): Secondary | ICD-10-CM | POA: Diagnosis not present

## 2018-05-12 DIAGNOSIS — Z96652 Presence of left artificial knee joint: Secondary | ICD-10-CM | POA: Diagnosis not present

## 2018-05-12 DIAGNOSIS — R2689 Other abnormalities of gait and mobility: Secondary | ICD-10-CM | POA: Diagnosis not present

## 2018-05-12 DIAGNOSIS — Z471 Aftercare following joint replacement surgery: Secondary | ICD-10-CM | POA: Diagnosis not present

## 2018-05-12 DIAGNOSIS — M25662 Stiffness of left knee, not elsewhere classified: Secondary | ICD-10-CM | POA: Diagnosis not present

## 2018-05-14 DIAGNOSIS — M25662 Stiffness of left knee, not elsewhere classified: Secondary | ICD-10-CM | POA: Diagnosis not present

## 2018-05-14 DIAGNOSIS — R2689 Other abnormalities of gait and mobility: Secondary | ICD-10-CM | POA: Diagnosis not present

## 2018-05-14 DIAGNOSIS — M6281 Muscle weakness (generalized): Secondary | ICD-10-CM | POA: Diagnosis not present

## 2018-05-14 DIAGNOSIS — Z96652 Presence of left artificial knee joint: Secondary | ICD-10-CM | POA: Diagnosis not present

## 2018-05-14 DIAGNOSIS — Z471 Aftercare following joint replacement surgery: Secondary | ICD-10-CM | POA: Diagnosis not present

## 2018-05-17 DIAGNOSIS — M25662 Stiffness of left knee, not elsewhere classified: Secondary | ICD-10-CM | POA: Diagnosis not present

## 2018-05-17 DIAGNOSIS — Z96652 Presence of left artificial knee joint: Secondary | ICD-10-CM | POA: Diagnosis not present

## 2018-05-17 DIAGNOSIS — M6281 Muscle weakness (generalized): Secondary | ICD-10-CM | POA: Diagnosis not present

## 2018-05-17 DIAGNOSIS — Z471 Aftercare following joint replacement surgery: Secondary | ICD-10-CM | POA: Diagnosis not present

## 2018-05-17 DIAGNOSIS — R2689 Other abnormalities of gait and mobility: Secondary | ICD-10-CM | POA: Diagnosis not present

## 2018-05-19 DIAGNOSIS — Z96652 Presence of left artificial knee joint: Secondary | ICD-10-CM | POA: Diagnosis not present

## 2018-05-19 DIAGNOSIS — Z471 Aftercare following joint replacement surgery: Secondary | ICD-10-CM | POA: Diagnosis not present

## 2018-05-19 DIAGNOSIS — M6281 Muscle weakness (generalized): Secondary | ICD-10-CM | POA: Diagnosis not present

## 2018-05-19 DIAGNOSIS — M25662 Stiffness of left knee, not elsewhere classified: Secondary | ICD-10-CM | POA: Diagnosis not present

## 2018-05-19 DIAGNOSIS — R2689 Other abnormalities of gait and mobility: Secondary | ICD-10-CM | POA: Diagnosis not present

## 2018-05-20 DIAGNOSIS — Z471 Aftercare following joint replacement surgery: Secondary | ICD-10-CM | POA: Diagnosis not present

## 2018-05-20 DIAGNOSIS — Z96652 Presence of left artificial knee joint: Secondary | ICD-10-CM | POA: Diagnosis not present

## 2018-05-21 DIAGNOSIS — Z471 Aftercare following joint replacement surgery: Secondary | ICD-10-CM | POA: Diagnosis not present

## 2018-05-21 DIAGNOSIS — R2689 Other abnormalities of gait and mobility: Secondary | ICD-10-CM | POA: Diagnosis not present

## 2018-05-21 DIAGNOSIS — M25662 Stiffness of left knee, not elsewhere classified: Secondary | ICD-10-CM | POA: Diagnosis not present

## 2018-05-21 DIAGNOSIS — M6281 Muscle weakness (generalized): Secondary | ICD-10-CM | POA: Diagnosis not present

## 2018-05-21 DIAGNOSIS — Z96652 Presence of left artificial knee joint: Secondary | ICD-10-CM | POA: Diagnosis not present

## 2018-05-24 DIAGNOSIS — R2689 Other abnormalities of gait and mobility: Secondary | ICD-10-CM | POA: Diagnosis not present

## 2018-05-24 DIAGNOSIS — M6281 Muscle weakness (generalized): Secondary | ICD-10-CM | POA: Diagnosis not present

## 2018-05-24 DIAGNOSIS — M25662 Stiffness of left knee, not elsewhere classified: Secondary | ICD-10-CM | POA: Diagnosis not present

## 2018-05-24 DIAGNOSIS — Z471 Aftercare following joint replacement surgery: Secondary | ICD-10-CM | POA: Diagnosis not present

## 2018-05-24 DIAGNOSIS — Z96652 Presence of left artificial knee joint: Secondary | ICD-10-CM | POA: Diagnosis not present

## 2018-05-26 DIAGNOSIS — Z96652 Presence of left artificial knee joint: Secondary | ICD-10-CM | POA: Diagnosis not present

## 2018-05-26 DIAGNOSIS — Z471 Aftercare following joint replacement surgery: Secondary | ICD-10-CM | POA: Diagnosis not present

## 2018-05-26 DIAGNOSIS — M25662 Stiffness of left knee, not elsewhere classified: Secondary | ICD-10-CM | POA: Diagnosis not present

## 2018-05-26 DIAGNOSIS — R2689 Other abnormalities of gait and mobility: Secondary | ICD-10-CM | POA: Diagnosis not present

## 2018-05-26 DIAGNOSIS — M6281 Muscle weakness (generalized): Secondary | ICD-10-CM | POA: Diagnosis not present

## 2018-05-28 DIAGNOSIS — M25662 Stiffness of left knee, not elsewhere classified: Secondary | ICD-10-CM | POA: Diagnosis not present

## 2018-05-28 DIAGNOSIS — Z96652 Presence of left artificial knee joint: Secondary | ICD-10-CM | POA: Diagnosis not present

## 2018-05-28 DIAGNOSIS — M6281 Muscle weakness (generalized): Secondary | ICD-10-CM | POA: Diagnosis not present

## 2018-05-28 DIAGNOSIS — R2689 Other abnormalities of gait and mobility: Secondary | ICD-10-CM | POA: Diagnosis not present

## 2018-05-28 DIAGNOSIS — Z471 Aftercare following joint replacement surgery: Secondary | ICD-10-CM | POA: Diagnosis not present

## 2018-05-31 DIAGNOSIS — M6281 Muscle weakness (generalized): Secondary | ICD-10-CM | POA: Diagnosis not present

## 2018-05-31 DIAGNOSIS — Z96652 Presence of left artificial knee joint: Secondary | ICD-10-CM | POA: Diagnosis not present

## 2018-05-31 DIAGNOSIS — Z471 Aftercare following joint replacement surgery: Secondary | ICD-10-CM | POA: Diagnosis not present

## 2018-05-31 DIAGNOSIS — M25662 Stiffness of left knee, not elsewhere classified: Secondary | ICD-10-CM | POA: Diagnosis not present

## 2018-05-31 DIAGNOSIS — R2689 Other abnormalities of gait and mobility: Secondary | ICD-10-CM | POA: Diagnosis not present

## 2018-06-02 DIAGNOSIS — R2689 Other abnormalities of gait and mobility: Secondary | ICD-10-CM | POA: Diagnosis not present

## 2018-06-02 DIAGNOSIS — M25662 Stiffness of left knee, not elsewhere classified: Secondary | ICD-10-CM | POA: Diagnosis not present

## 2018-06-02 DIAGNOSIS — M6281 Muscle weakness (generalized): Secondary | ICD-10-CM | POA: Diagnosis not present

## 2018-06-02 DIAGNOSIS — Z471 Aftercare following joint replacement surgery: Secondary | ICD-10-CM | POA: Diagnosis not present

## 2018-06-02 DIAGNOSIS — Z96652 Presence of left artificial knee joint: Secondary | ICD-10-CM | POA: Diagnosis not present

## 2018-06-04 DIAGNOSIS — R2689 Other abnormalities of gait and mobility: Secondary | ICD-10-CM | POA: Diagnosis not present

## 2018-06-04 DIAGNOSIS — M25662 Stiffness of left knee, not elsewhere classified: Secondary | ICD-10-CM | POA: Diagnosis not present

## 2018-06-04 DIAGNOSIS — Z471 Aftercare following joint replacement surgery: Secondary | ICD-10-CM | POA: Diagnosis not present

## 2018-06-04 DIAGNOSIS — Z96652 Presence of left artificial knee joint: Secondary | ICD-10-CM | POA: Diagnosis not present

## 2018-06-04 DIAGNOSIS — M6281 Muscle weakness (generalized): Secondary | ICD-10-CM | POA: Diagnosis not present

## 2018-06-07 DIAGNOSIS — M6281 Muscle weakness (generalized): Secondary | ICD-10-CM | POA: Diagnosis not present

## 2018-06-07 DIAGNOSIS — Z471 Aftercare following joint replacement surgery: Secondary | ICD-10-CM | POA: Diagnosis not present

## 2018-06-07 DIAGNOSIS — M25662 Stiffness of left knee, not elsewhere classified: Secondary | ICD-10-CM | POA: Diagnosis not present

## 2018-06-07 DIAGNOSIS — R2689 Other abnormalities of gait and mobility: Secondary | ICD-10-CM | POA: Diagnosis not present

## 2018-06-07 DIAGNOSIS — Z96652 Presence of left artificial knee joint: Secondary | ICD-10-CM | POA: Diagnosis not present

## 2018-06-09 DIAGNOSIS — Z471 Aftercare following joint replacement surgery: Secondary | ICD-10-CM | POA: Diagnosis not present

## 2018-06-09 DIAGNOSIS — Z96652 Presence of left artificial knee joint: Secondary | ICD-10-CM | POA: Diagnosis not present

## 2018-06-09 DIAGNOSIS — M25662 Stiffness of left knee, not elsewhere classified: Secondary | ICD-10-CM | POA: Diagnosis not present

## 2018-06-09 DIAGNOSIS — R2689 Other abnormalities of gait and mobility: Secondary | ICD-10-CM | POA: Diagnosis not present

## 2018-06-09 DIAGNOSIS — M6281 Muscle weakness (generalized): Secondary | ICD-10-CM | POA: Diagnosis not present

## 2018-06-11 DIAGNOSIS — R2689 Other abnormalities of gait and mobility: Secondary | ICD-10-CM | POA: Diagnosis not present

## 2018-06-11 DIAGNOSIS — M25662 Stiffness of left knee, not elsewhere classified: Secondary | ICD-10-CM | POA: Diagnosis not present

## 2018-06-11 DIAGNOSIS — Z96652 Presence of left artificial knee joint: Secondary | ICD-10-CM | POA: Diagnosis not present

## 2018-06-11 DIAGNOSIS — Z471 Aftercare following joint replacement surgery: Secondary | ICD-10-CM | POA: Diagnosis not present

## 2018-06-11 DIAGNOSIS — M6281 Muscle weakness (generalized): Secondary | ICD-10-CM | POA: Diagnosis not present

## 2018-06-14 DIAGNOSIS — M25662 Stiffness of left knee, not elsewhere classified: Secondary | ICD-10-CM | POA: Diagnosis not present

## 2018-06-14 DIAGNOSIS — Z471 Aftercare following joint replacement surgery: Secondary | ICD-10-CM | POA: Diagnosis not present

## 2018-06-14 DIAGNOSIS — M6281 Muscle weakness (generalized): Secondary | ICD-10-CM | POA: Diagnosis not present

## 2018-06-14 DIAGNOSIS — Z96652 Presence of left artificial knee joint: Secondary | ICD-10-CM | POA: Diagnosis not present

## 2018-06-14 DIAGNOSIS — R2689 Other abnormalities of gait and mobility: Secondary | ICD-10-CM | POA: Diagnosis not present

## 2018-06-16 DIAGNOSIS — M6281 Muscle weakness (generalized): Secondary | ICD-10-CM | POA: Diagnosis not present

## 2018-06-16 DIAGNOSIS — Z96652 Presence of left artificial knee joint: Secondary | ICD-10-CM | POA: Diagnosis not present

## 2018-06-16 DIAGNOSIS — Z471 Aftercare following joint replacement surgery: Secondary | ICD-10-CM | POA: Diagnosis not present

## 2018-06-16 DIAGNOSIS — R2689 Other abnormalities of gait and mobility: Secondary | ICD-10-CM | POA: Diagnosis not present

## 2018-06-16 DIAGNOSIS — M25662 Stiffness of left knee, not elsewhere classified: Secondary | ICD-10-CM | POA: Diagnosis not present

## 2018-06-17 DIAGNOSIS — Z96652 Presence of left artificial knee joint: Secondary | ICD-10-CM | POA: Diagnosis not present

## 2018-06-17 DIAGNOSIS — Z471 Aftercare following joint replacement surgery: Secondary | ICD-10-CM | POA: Diagnosis not present

## 2018-06-17 DIAGNOSIS — M6281 Muscle weakness (generalized): Secondary | ICD-10-CM | POA: Diagnosis not present

## 2018-06-17 DIAGNOSIS — M25662 Stiffness of left knee, not elsewhere classified: Secondary | ICD-10-CM | POA: Diagnosis not present

## 2018-06-17 DIAGNOSIS — R2689 Other abnormalities of gait and mobility: Secondary | ICD-10-CM | POA: Diagnosis not present

## 2018-06-21 DIAGNOSIS — M6281 Muscle weakness (generalized): Secondary | ICD-10-CM | POA: Diagnosis not present

## 2018-06-21 DIAGNOSIS — R2689 Other abnormalities of gait and mobility: Secondary | ICD-10-CM | POA: Diagnosis not present

## 2018-06-21 DIAGNOSIS — M25662 Stiffness of left knee, not elsewhere classified: Secondary | ICD-10-CM | POA: Diagnosis not present

## 2018-06-21 DIAGNOSIS — Z471 Aftercare following joint replacement surgery: Secondary | ICD-10-CM | POA: Diagnosis not present

## 2018-06-21 DIAGNOSIS — Z96652 Presence of left artificial knee joint: Secondary | ICD-10-CM | POA: Diagnosis not present

## 2018-06-22 DIAGNOSIS — M25662 Stiffness of left knee, not elsewhere classified: Secondary | ICD-10-CM | POA: Diagnosis not present

## 2018-06-22 DIAGNOSIS — Z96652 Presence of left artificial knee joint: Secondary | ICD-10-CM | POA: Diagnosis not present

## 2018-06-22 DIAGNOSIS — M6281 Muscle weakness (generalized): Secondary | ICD-10-CM | POA: Diagnosis not present

## 2018-06-22 DIAGNOSIS — R2689 Other abnormalities of gait and mobility: Secondary | ICD-10-CM | POA: Diagnosis not present

## 2018-06-22 DIAGNOSIS — Z471 Aftercare following joint replacement surgery: Secondary | ICD-10-CM | POA: Diagnosis not present

## 2018-06-25 DIAGNOSIS — R2689 Other abnormalities of gait and mobility: Secondary | ICD-10-CM | POA: Diagnosis not present

## 2018-06-25 DIAGNOSIS — M25662 Stiffness of left knee, not elsewhere classified: Secondary | ICD-10-CM | POA: Diagnosis not present

## 2018-06-25 DIAGNOSIS — Z96652 Presence of left artificial knee joint: Secondary | ICD-10-CM | POA: Diagnosis not present

## 2018-06-25 DIAGNOSIS — Z471 Aftercare following joint replacement surgery: Secondary | ICD-10-CM | POA: Diagnosis not present

## 2018-06-25 DIAGNOSIS — M6281 Muscle weakness (generalized): Secondary | ICD-10-CM | POA: Diagnosis not present

## 2018-06-29 DIAGNOSIS — M6281 Muscle weakness (generalized): Secondary | ICD-10-CM | POA: Diagnosis not present

## 2018-06-29 DIAGNOSIS — M25662 Stiffness of left knee, not elsewhere classified: Secondary | ICD-10-CM | POA: Diagnosis not present

## 2018-06-29 DIAGNOSIS — R2689 Other abnormalities of gait and mobility: Secondary | ICD-10-CM | POA: Diagnosis not present

## 2018-06-29 DIAGNOSIS — Z471 Aftercare following joint replacement surgery: Secondary | ICD-10-CM | POA: Diagnosis not present

## 2018-06-29 DIAGNOSIS — Z96652 Presence of left artificial knee joint: Secondary | ICD-10-CM | POA: Diagnosis not present

## 2018-07-06 DIAGNOSIS — R2689 Other abnormalities of gait and mobility: Secondary | ICD-10-CM | POA: Diagnosis not present

## 2018-07-06 DIAGNOSIS — Z96652 Presence of left artificial knee joint: Secondary | ICD-10-CM | POA: Diagnosis not present

## 2018-07-06 DIAGNOSIS — M6281 Muscle weakness (generalized): Secondary | ICD-10-CM | POA: Diagnosis not present

## 2018-07-06 DIAGNOSIS — M25662 Stiffness of left knee, not elsewhere classified: Secondary | ICD-10-CM | POA: Diagnosis not present

## 2018-07-06 DIAGNOSIS — Z471 Aftercare following joint replacement surgery: Secondary | ICD-10-CM | POA: Diagnosis not present

## 2018-07-09 DIAGNOSIS — R2689 Other abnormalities of gait and mobility: Secondary | ICD-10-CM | POA: Diagnosis not present

## 2018-07-09 DIAGNOSIS — Z96652 Presence of left artificial knee joint: Secondary | ICD-10-CM | POA: Diagnosis not present

## 2018-07-09 DIAGNOSIS — M6281 Muscle weakness (generalized): Secondary | ICD-10-CM | POA: Diagnosis not present

## 2018-07-09 DIAGNOSIS — Z471 Aftercare following joint replacement surgery: Secondary | ICD-10-CM | POA: Diagnosis not present

## 2018-07-09 DIAGNOSIS — M25662 Stiffness of left knee, not elsewhere classified: Secondary | ICD-10-CM | POA: Diagnosis not present

## 2018-07-12 DIAGNOSIS — M6281 Muscle weakness (generalized): Secondary | ICD-10-CM | POA: Diagnosis not present

## 2018-07-12 DIAGNOSIS — M25662 Stiffness of left knee, not elsewhere classified: Secondary | ICD-10-CM | POA: Diagnosis not present

## 2018-07-12 DIAGNOSIS — Z96652 Presence of left artificial knee joint: Secondary | ICD-10-CM | POA: Diagnosis not present

## 2018-07-12 DIAGNOSIS — Z471 Aftercare following joint replacement surgery: Secondary | ICD-10-CM | POA: Diagnosis not present

## 2018-07-12 DIAGNOSIS — R2689 Other abnormalities of gait and mobility: Secondary | ICD-10-CM | POA: Diagnosis not present

## 2018-07-15 DIAGNOSIS — R2689 Other abnormalities of gait and mobility: Secondary | ICD-10-CM | POA: Diagnosis not present

## 2018-07-15 DIAGNOSIS — Z471 Aftercare following joint replacement surgery: Secondary | ICD-10-CM | POA: Diagnosis not present

## 2018-07-15 DIAGNOSIS — M25662 Stiffness of left knee, not elsewhere classified: Secondary | ICD-10-CM | POA: Diagnosis not present

## 2018-07-15 DIAGNOSIS — Z96652 Presence of left artificial knee joint: Secondary | ICD-10-CM | POA: Diagnosis not present

## 2018-07-15 DIAGNOSIS — M6281 Muscle weakness (generalized): Secondary | ICD-10-CM | POA: Diagnosis not present

## 2018-07-19 DIAGNOSIS — M6281 Muscle weakness (generalized): Secondary | ICD-10-CM | POA: Diagnosis not present

## 2018-07-19 DIAGNOSIS — R2689 Other abnormalities of gait and mobility: Secondary | ICD-10-CM | POA: Diagnosis not present

## 2018-07-19 DIAGNOSIS — Z471 Aftercare following joint replacement surgery: Secondary | ICD-10-CM | POA: Diagnosis not present

## 2018-07-19 DIAGNOSIS — Z96652 Presence of left artificial knee joint: Secondary | ICD-10-CM | POA: Diagnosis not present

## 2018-07-19 DIAGNOSIS — M25662 Stiffness of left knee, not elsewhere classified: Secondary | ICD-10-CM | POA: Diagnosis not present

## 2018-07-23 DIAGNOSIS — Z471 Aftercare following joint replacement surgery: Secondary | ICD-10-CM | POA: Diagnosis not present

## 2018-07-23 DIAGNOSIS — M25662 Stiffness of left knee, not elsewhere classified: Secondary | ICD-10-CM | POA: Diagnosis not present

## 2018-07-23 DIAGNOSIS — Z96652 Presence of left artificial knee joint: Secondary | ICD-10-CM | POA: Diagnosis not present

## 2018-07-23 DIAGNOSIS — R2689 Other abnormalities of gait and mobility: Secondary | ICD-10-CM | POA: Diagnosis not present

## 2018-07-23 DIAGNOSIS — M6281 Muscle weakness (generalized): Secondary | ICD-10-CM | POA: Diagnosis not present

## 2018-07-26 DIAGNOSIS — R2689 Other abnormalities of gait and mobility: Secondary | ICD-10-CM | POA: Diagnosis not present

## 2018-07-26 DIAGNOSIS — Z471 Aftercare following joint replacement surgery: Secondary | ICD-10-CM | POA: Diagnosis not present

## 2018-07-26 DIAGNOSIS — M6281 Muscle weakness (generalized): Secondary | ICD-10-CM | POA: Diagnosis not present

## 2018-07-26 DIAGNOSIS — M25662 Stiffness of left knee, not elsewhere classified: Secondary | ICD-10-CM | POA: Diagnosis not present

## 2018-07-26 DIAGNOSIS — Z96652 Presence of left artificial knee joint: Secondary | ICD-10-CM | POA: Diagnosis not present

## 2018-07-30 DIAGNOSIS — R2689 Other abnormalities of gait and mobility: Secondary | ICD-10-CM | POA: Diagnosis not present

## 2018-07-30 DIAGNOSIS — Z471 Aftercare following joint replacement surgery: Secondary | ICD-10-CM | POA: Diagnosis not present

## 2018-07-30 DIAGNOSIS — Z96652 Presence of left artificial knee joint: Secondary | ICD-10-CM | POA: Diagnosis not present

## 2018-07-30 DIAGNOSIS — M25662 Stiffness of left knee, not elsewhere classified: Secondary | ICD-10-CM | POA: Diagnosis not present

## 2018-07-30 DIAGNOSIS — M6281 Muscle weakness (generalized): Secondary | ICD-10-CM | POA: Diagnosis not present

## 2018-08-06 DIAGNOSIS — J019 Acute sinusitis, unspecified: Secondary | ICD-10-CM | POA: Diagnosis not present

## 2018-08-06 DIAGNOSIS — J449 Chronic obstructive pulmonary disease, unspecified: Secondary | ICD-10-CM | POA: Diagnosis not present

## 2018-08-06 DIAGNOSIS — J209 Acute bronchitis, unspecified: Secondary | ICD-10-CM | POA: Diagnosis not present

## 2018-08-06 DIAGNOSIS — Z6829 Body mass index (BMI) 29.0-29.9, adult: Secondary | ICD-10-CM | POA: Diagnosis not present

## 2018-10-07 DIAGNOSIS — Z96659 Presence of unspecified artificial knee joint: Secondary | ICD-10-CM | POA: Diagnosis not present

## 2018-10-07 DIAGNOSIS — Z471 Aftercare following joint replacement surgery: Secondary | ICD-10-CM | POA: Diagnosis not present

## 2018-10-07 DIAGNOSIS — Z96652 Presence of left artificial knee joint: Secondary | ICD-10-CM | POA: Diagnosis not present

## 2018-11-30 DIAGNOSIS — Z96652 Presence of left artificial knee joint: Secondary | ICD-10-CM | POA: Diagnosis not present

## 2018-11-30 DIAGNOSIS — Z471 Aftercare following joint replacement surgery: Secondary | ICD-10-CM | POA: Diagnosis not present

## 2019-01-28 ENCOUNTER — Other Ambulatory Visit: Payer: Self-pay

## 2019-02-25 DIAGNOSIS — T8484XA Pain due to internal orthopedic prosthetic devices, implants and grafts, initial encounter: Secondary | ICD-10-CM | POA: Diagnosis not present

## 2019-02-25 DIAGNOSIS — Z96652 Presence of left artificial knee joint: Secondary | ICD-10-CM | POA: Diagnosis not present

## 2019-05-30 DIAGNOSIS — J449 Chronic obstructive pulmonary disease, unspecified: Secondary | ICD-10-CM | POA: Diagnosis not present

## 2019-05-30 DIAGNOSIS — E782 Mixed hyperlipidemia: Secondary | ICD-10-CM | POA: Diagnosis not present

## 2019-06-20 DIAGNOSIS — E119 Type 2 diabetes mellitus without complications: Secondary | ICD-10-CM | POA: Diagnosis not present

## 2019-06-20 DIAGNOSIS — E782 Mixed hyperlipidemia: Secondary | ICD-10-CM | POA: Diagnosis not present

## 2019-06-20 DIAGNOSIS — J449 Chronic obstructive pulmonary disease, unspecified: Secondary | ICD-10-CM | POA: Diagnosis not present

## 2019-06-20 DIAGNOSIS — N529 Male erectile dysfunction, unspecified: Secondary | ICD-10-CM | POA: Diagnosis not present

## 2019-06-20 DIAGNOSIS — K760 Fatty (change of) liver, not elsewhere classified: Secondary | ICD-10-CM | POA: Diagnosis not present

## 2019-06-20 DIAGNOSIS — R5383 Other fatigue: Secondary | ICD-10-CM | POA: Diagnosis not present

## 2019-06-20 DIAGNOSIS — R0902 Hypoxemia: Secondary | ICD-10-CM | POA: Diagnosis not present

## 2019-06-28 DIAGNOSIS — I2510 Atherosclerotic heart disease of native coronary artery without angina pectoris: Secondary | ICD-10-CM | POA: Diagnosis not present

## 2019-06-28 DIAGNOSIS — J449 Chronic obstructive pulmonary disease, unspecified: Secondary | ICD-10-CM | POA: Diagnosis not present

## 2019-06-28 DIAGNOSIS — E1169 Type 2 diabetes mellitus with other specified complication: Secondary | ICD-10-CM | POA: Diagnosis not present

## 2019-06-28 DIAGNOSIS — Z6827 Body mass index (BMI) 27.0-27.9, adult: Secondary | ICD-10-CM | POA: Diagnosis not present

## 2019-06-28 DIAGNOSIS — Z20828 Contact with and (suspected) exposure to other viral communicable diseases: Secondary | ICD-10-CM | POA: Diagnosis not present

## 2019-06-28 DIAGNOSIS — R351 Nocturia: Secondary | ICD-10-CM | POA: Diagnosis not present

## 2019-06-28 DIAGNOSIS — K760 Fatty (change of) liver, not elsewhere classified: Secondary | ICD-10-CM | POA: Diagnosis not present

## 2019-06-28 DIAGNOSIS — I517 Cardiomegaly: Secondary | ICD-10-CM | POA: Diagnosis not present

## 2019-07-08 DIAGNOSIS — Z77090 Contact with and (suspected) exposure to asbestos: Secondary | ICD-10-CM | POA: Diagnosis not present

## 2019-07-08 DIAGNOSIS — R0600 Dyspnea, unspecified: Secondary | ICD-10-CM | POA: Diagnosis not present

## 2019-08-01 DIAGNOSIS — I10 Essential (primary) hypertension: Secondary | ICD-10-CM | POA: Diagnosis not present

## 2019-08-01 DIAGNOSIS — R0600 Dyspnea, unspecified: Secondary | ICD-10-CM | POA: Diagnosis not present

## 2019-08-01 DIAGNOSIS — R0602 Shortness of breath: Secondary | ICD-10-CM | POA: Diagnosis not present

## 2019-08-01 DIAGNOSIS — E119 Type 2 diabetes mellitus without complications: Secondary | ICD-10-CM | POA: Diagnosis not present

## 2019-08-09 DIAGNOSIS — Z23 Encounter for immunization: Secondary | ICD-10-CM | POA: Diagnosis not present

## 2019-08-30 DIAGNOSIS — Z23 Encounter for immunization: Secondary | ICD-10-CM | POA: Diagnosis not present

## 2019-09-22 DIAGNOSIS — L818 Other specified disorders of pigmentation: Secondary | ICD-10-CM | POA: Diagnosis not present

## 2019-09-22 DIAGNOSIS — C44319 Basal cell carcinoma of skin of other parts of face: Secondary | ICD-10-CM | POA: Diagnosis not present

## 2019-09-22 DIAGNOSIS — Z1283 Encounter for screening for malignant neoplasm of skin: Secondary | ICD-10-CM | POA: Diagnosis not present

## 2019-09-22 DIAGNOSIS — D225 Melanocytic nevi of trunk: Secondary | ICD-10-CM | POA: Diagnosis not present

## 2019-09-22 DIAGNOSIS — L814 Other melanin hyperpigmentation: Secondary | ICD-10-CM | POA: Diagnosis not present

## 2019-09-22 DIAGNOSIS — B078 Other viral warts: Secondary | ICD-10-CM | POA: Diagnosis not present

## 2019-09-26 DIAGNOSIS — Z96652 Presence of left artificial knee joint: Secondary | ICD-10-CM | POA: Diagnosis not present

## 2019-09-26 DIAGNOSIS — M25562 Pain in left knee: Secondary | ICD-10-CM | POA: Diagnosis not present

## 2019-09-26 DIAGNOSIS — M62552 Muscle wasting and atrophy, not elsewhere classified, left thigh: Secondary | ICD-10-CM | POA: Diagnosis not present

## 2019-10-05 DIAGNOSIS — M4319 Spondylolisthesis, multiple sites in spine: Secondary | ICD-10-CM | POA: Diagnosis not present

## 2019-10-05 DIAGNOSIS — M5031 Other cervical disc degeneration,  high cervical region: Secondary | ICD-10-CM | POA: Diagnosis not present

## 2019-10-05 DIAGNOSIS — M47812 Spondylosis without myelopathy or radiculopathy, cervical region: Secondary | ICD-10-CM | POA: Diagnosis not present

## 2019-10-06 DIAGNOSIS — M47812 Spondylosis without myelopathy or radiculopathy, cervical region: Secondary | ICD-10-CM | POA: Diagnosis not present

## 2019-10-06 DIAGNOSIS — M9901 Segmental and somatic dysfunction of cervical region: Secondary | ICD-10-CM | POA: Diagnosis not present

## 2019-10-07 DIAGNOSIS — S161XXA Strain of muscle, fascia and tendon at neck level, initial encounter: Secondary | ICD-10-CM | POA: Diagnosis not present

## 2019-10-10 DIAGNOSIS — Z85828 Personal history of other malignant neoplasm of skin: Secondary | ICD-10-CM | POA: Diagnosis not present

## 2019-10-10 DIAGNOSIS — Z08 Encounter for follow-up examination after completed treatment for malignant neoplasm: Secondary | ICD-10-CM | POA: Diagnosis not present

## 2019-11-01 DIAGNOSIS — S161XXD Strain of muscle, fascia and tendon at neck level, subsequent encounter: Secondary | ICD-10-CM | POA: Diagnosis not present

## 2019-11-03 DIAGNOSIS — Z08 Encounter for follow-up examination after completed treatment for malignant neoplasm: Secondary | ICD-10-CM | POA: Diagnosis not present

## 2019-11-03 DIAGNOSIS — Z85828 Personal history of other malignant neoplasm of skin: Secondary | ICD-10-CM | POA: Diagnosis not present

## 2019-11-29 DIAGNOSIS — H6123 Impacted cerumen, bilateral: Secondary | ICD-10-CM | POA: Diagnosis not present

## 2020-01-14 IMAGING — DX DG KNEE 1-2V PORT*L*
2 series · 2 of 2 positions shown · non-contrast
Comparison: 07/24/2016

CLINICAL DATA: Post left knee arthroplasty.

EXAM:
PORTABLE LEFT KNEE - 1-2 VIEW

[knee ap]
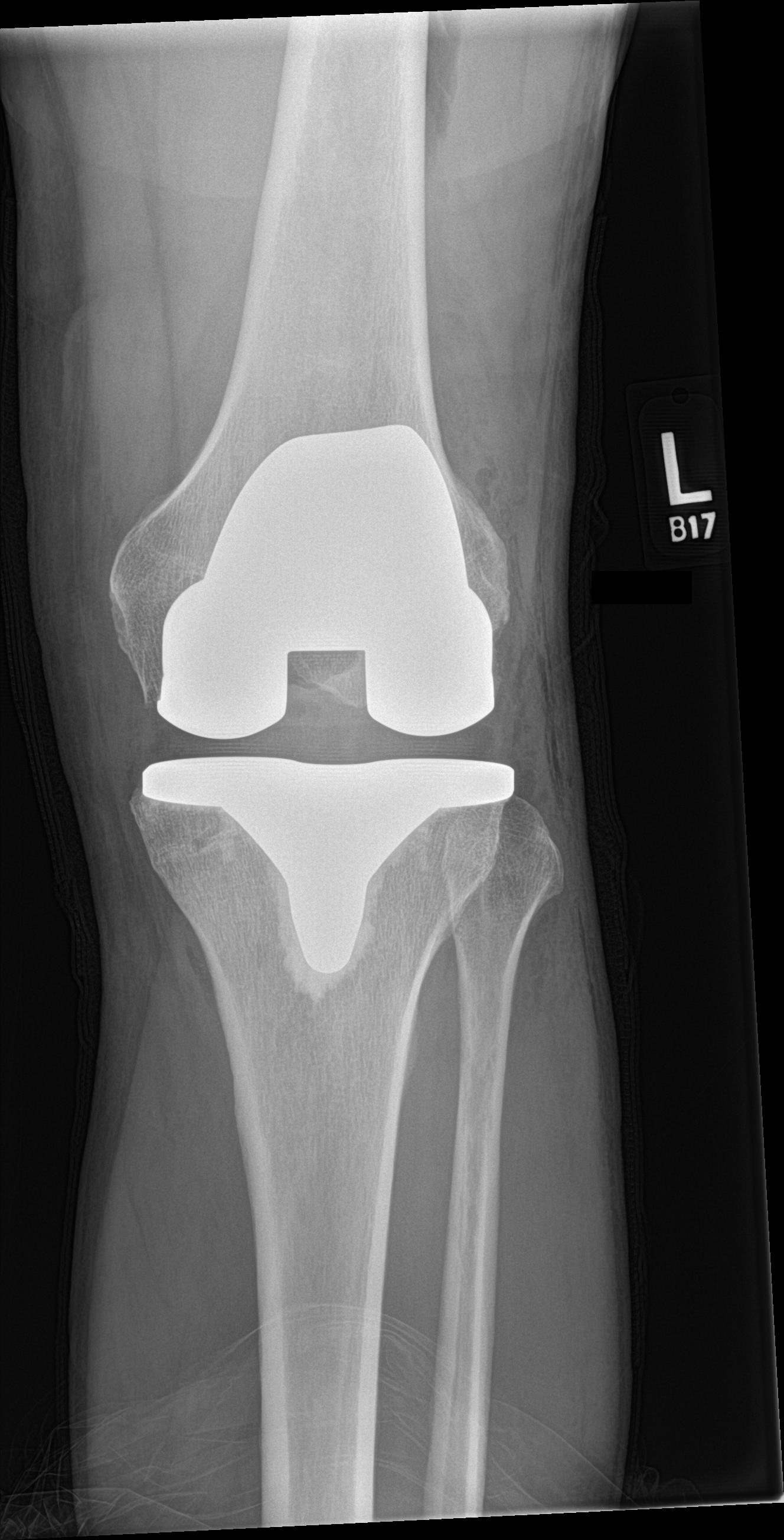

[knee lat]
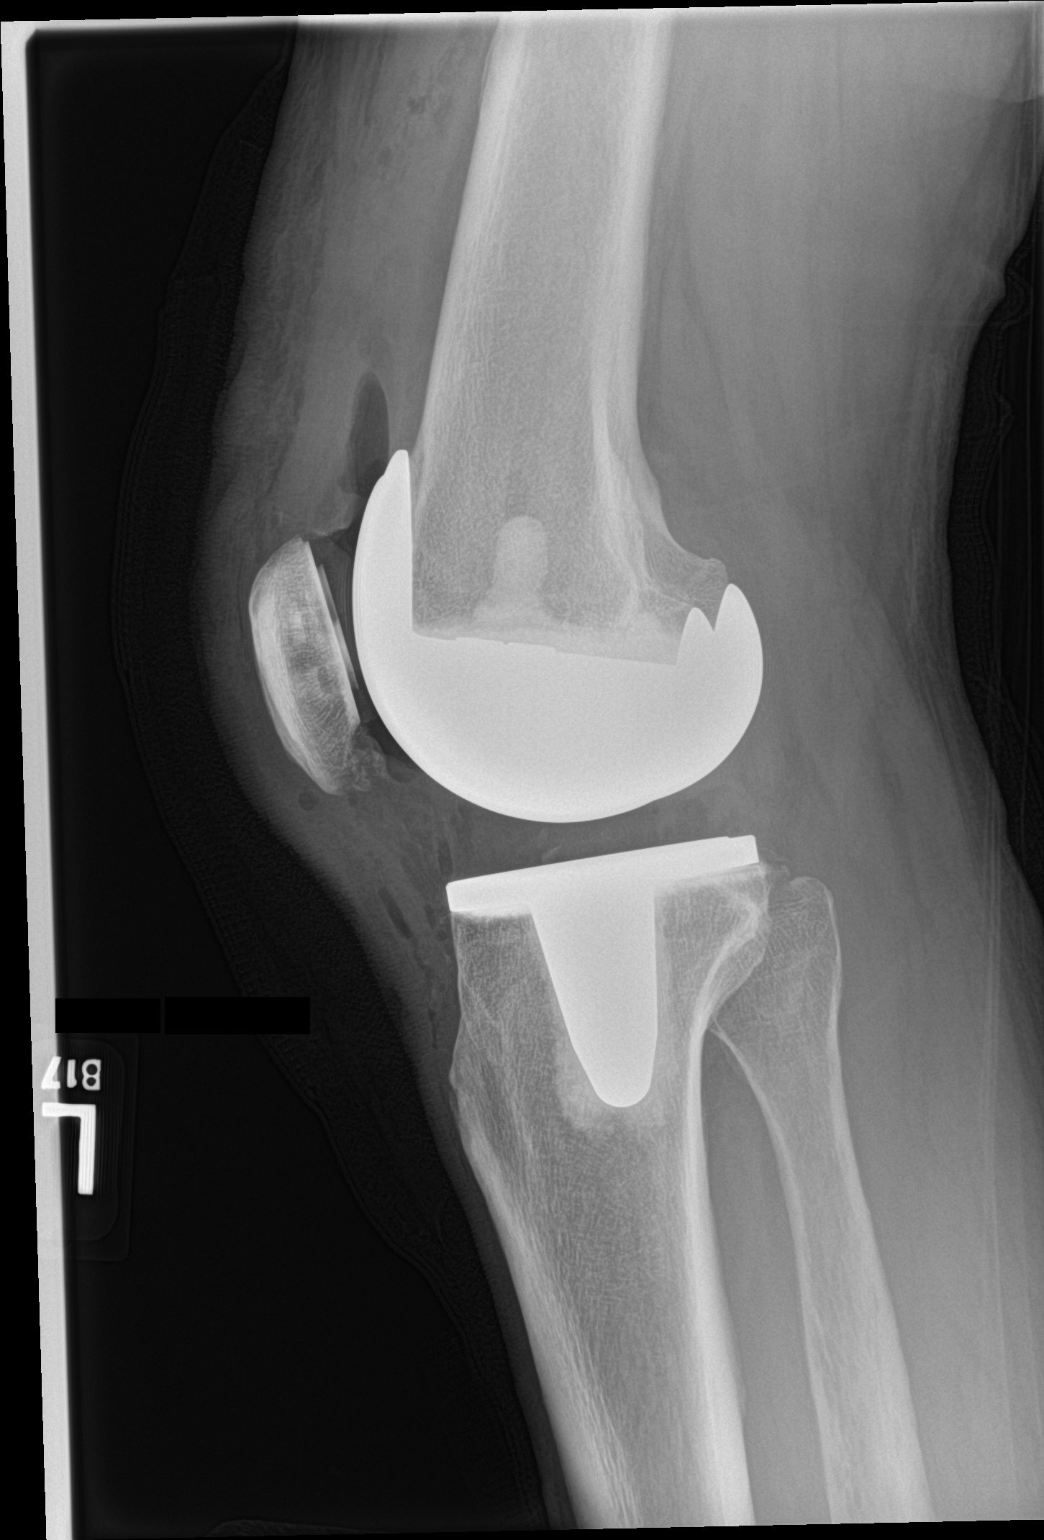

[2 of 2 positions shown; findings below may reference images not displayed]

FINDINGS: Post 3 component left knee arthroplasty with normal alignment of the
orthopedic hardware. No evidence of fracture. Expected soft tissue
edema and emphysema.
IMPRESSION: Post 3 component left knee arthroplasty with normal alignment of the
orthopedic hardware.

## 2020-05-03 DIAGNOSIS — L820 Inflamed seborrheic keratosis: Secondary | ICD-10-CM | POA: Diagnosis not present

## 2020-05-03 DIAGNOSIS — Z08 Encounter for follow-up examination after completed treatment for malignant neoplasm: Secondary | ICD-10-CM | POA: Diagnosis not present

## 2020-05-03 DIAGNOSIS — C4441 Basal cell carcinoma of skin of scalp and neck: Secondary | ICD-10-CM | POA: Diagnosis not present

## 2020-05-03 DIAGNOSIS — B078 Other viral warts: Secondary | ICD-10-CM | POA: Diagnosis not present

## 2020-05-03 DIAGNOSIS — Z85828 Personal history of other malignant neoplasm of skin: Secondary | ICD-10-CM | POA: Diagnosis not present

## 2020-06-05 DIAGNOSIS — H2513 Age-related nuclear cataract, bilateral: Secondary | ICD-10-CM | POA: Diagnosis not present

## 2020-06-05 DIAGNOSIS — Z9889 Other specified postprocedural states: Secondary | ICD-10-CM | POA: Diagnosis not present

## 2020-06-12 ENCOUNTER — Other Ambulatory Visit (HOSPITAL_COMMUNITY): Payer: Self-pay | Admitting: Orthopedic Surgery

## 2020-06-12 DIAGNOSIS — Z96652 Presence of left artificial knee joint: Secondary | ICD-10-CM | POA: Diagnosis not present

## 2020-06-12 DIAGNOSIS — M25562 Pain in left knee: Secondary | ICD-10-CM

## 2020-06-12 DIAGNOSIS — M7632 Iliotibial band syndrome, left leg: Secondary | ICD-10-CM | POA: Diagnosis not present

## 2020-07-18 ENCOUNTER — Encounter (HOSPITAL_COMMUNITY): Payer: Self-pay

## 2020-07-18 ENCOUNTER — Encounter (HOSPITAL_COMMUNITY): Payer: Medicare Other

## 2020-07-18 ENCOUNTER — Encounter (HOSPITAL_COMMUNITY): Admission: RE | Admit: 2020-07-18 | Payer: Medicare Other | Source: Ambulatory Visit

## 2020-07-27 ENCOUNTER — Encounter (HOSPITAL_COMMUNITY)
Admission: RE | Admit: 2020-07-27 | Discharge: 2020-07-27 | Disposition: A | Discharge status: Home / Self Care | Payer: Medicare Other | Source: Ambulatory Visit | Attending: Orthopedic Surgery | Admitting: Orthopedic Surgery

## 2020-07-27 ENCOUNTER — Other Ambulatory Visit: Payer: Self-pay

## 2020-07-27 DIAGNOSIS — M25562 Pain in left knee: Secondary | ICD-10-CM | POA: Insufficient documentation

## 2020-07-27 DIAGNOSIS — M7989 Other specified soft tissue disorders: Secondary | ICD-10-CM | POA: Diagnosis not present

## 2020-07-27 DIAGNOSIS — Z96652 Presence of left artificial knee joint: Secondary | ICD-10-CM | POA: Diagnosis not present

## 2020-07-27 DIAGNOSIS — M1711 Unilateral primary osteoarthritis, right knee: Secondary | ICD-10-CM | POA: Diagnosis not present

## 2020-07-27 MED ORDER — TECHNETIUM TC 99M MEDRONATE IV KIT
21.0000 | PACK | Freq: Once | INTRAVENOUS | Status: AC
  Administered 2020-07-27: 21 via INTRAVENOUS

## 2020-08-01 DIAGNOSIS — H52222 Regular astigmatism, left eye: Secondary | ICD-10-CM | POA: Diagnosis not present

## 2020-08-01 DIAGNOSIS — H524 Presbyopia: Secondary | ICD-10-CM | POA: Diagnosis not present

## 2020-08-01 DIAGNOSIS — H25812 Combined forms of age-related cataract, left eye: Secondary | ICD-10-CM | POA: Diagnosis not present

## 2020-08-15 DIAGNOSIS — H524 Presbyopia: Secondary | ICD-10-CM | POA: Diagnosis not present

## 2020-08-15 DIAGNOSIS — H25811 Combined forms of age-related cataract, right eye: Secondary | ICD-10-CM | POA: Diagnosis not present

## 2020-08-15 DIAGNOSIS — H52221 Regular astigmatism, right eye: Secondary | ICD-10-CM | POA: Diagnosis not present

## 2020-09-12 DIAGNOSIS — Z961 Presence of intraocular lens: Secondary | ICD-10-CM | POA: Diagnosis not present

## 2020-09-12 DIAGNOSIS — Z9841 Cataract extraction status, right eye: Secondary | ICD-10-CM | POA: Diagnosis not present

## 2020-12-18 DIAGNOSIS — M25562 Pain in left knee: Secondary | ICD-10-CM | POA: Diagnosis not present

## 2020-12-18 DIAGNOSIS — Z96659 Presence of unspecified artificial knee joint: Secondary | ICD-10-CM | POA: Diagnosis not present

## 2020-12-18 DIAGNOSIS — Z96652 Presence of left artificial knee joint: Secondary | ICD-10-CM | POA: Diagnosis not present

## 2020-12-24 DIAGNOSIS — Z125 Encounter for screening for malignant neoplasm of prostate: Secondary | ICD-10-CM | POA: Diagnosis not present

## 2020-12-24 DIAGNOSIS — E782 Mixed hyperlipidemia: Secondary | ICD-10-CM | POA: Diagnosis not present

## 2020-12-24 DIAGNOSIS — N529 Male erectile dysfunction, unspecified: Secondary | ICD-10-CM | POA: Diagnosis not present

## 2020-12-24 DIAGNOSIS — E7849 Other hyperlipidemia: Secondary | ICD-10-CM | POA: Diagnosis not present

## 2020-12-24 DIAGNOSIS — E1169 Type 2 diabetes mellitus with other specified complication: Secondary | ICD-10-CM | POA: Diagnosis not present

## 2020-12-24 DIAGNOSIS — R5383 Other fatigue: Secondary | ICD-10-CM | POA: Diagnosis not present

## 2020-12-24 DIAGNOSIS — Z1329 Encounter for screening for other suspected endocrine disorder: Secondary | ICD-10-CM | POA: Diagnosis not present

## 2020-12-27 DIAGNOSIS — Z77098 Contact with and (suspected) exposure to other hazardous, chiefly nonmedicinal, chemicals: Secondary | ICD-10-CM | POA: Diagnosis not present

## 2020-12-27 DIAGNOSIS — Z1389 Encounter for screening for other disorder: Secondary | ICD-10-CM | POA: Diagnosis not present

## 2020-12-27 DIAGNOSIS — I517 Cardiomegaly: Secondary | ICD-10-CM | POA: Diagnosis not present

## 2020-12-27 DIAGNOSIS — Z1331 Encounter for screening for depression: Secondary | ICD-10-CM | POA: Diagnosis not present

## 2020-12-27 DIAGNOSIS — E1169 Type 2 diabetes mellitus with other specified complication: Secondary | ICD-10-CM | POA: Diagnosis not present

## 2020-12-27 DIAGNOSIS — I2510 Atherosclerotic heart disease of native coronary artery without angina pectoris: Secondary | ICD-10-CM | POA: Diagnosis not present

## 2020-12-27 DIAGNOSIS — J449 Chronic obstructive pulmonary disease, unspecified: Secondary | ICD-10-CM | POA: Diagnosis not present

## 2020-12-27 DIAGNOSIS — E7849 Other hyperlipidemia: Secondary | ICD-10-CM | POA: Diagnosis not present

## 2021-02-11 ENCOUNTER — Ambulatory Visit (INDEPENDENT_AMBULATORY_CARE_PROVIDER_SITE_OTHER): Payer: Medicare Other | Admitting: Pulmonary Disease

## 2021-02-11 ENCOUNTER — Encounter: Payer: Self-pay | Admitting: Pulmonary Disease

## 2021-02-11 ENCOUNTER — Other Ambulatory Visit: Payer: Self-pay

## 2021-02-11 ENCOUNTER — Ambulatory Visit (INDEPENDENT_AMBULATORY_CARE_PROVIDER_SITE_OTHER): Payer: Medicare Other

## 2021-02-11 VITALS — BP 114/72 | HR 75 | Temp 97.7°F | Ht 72.0 in | Wt 218.8 lb

## 2021-02-11 DIAGNOSIS — R059 Cough, unspecified: Secondary | ICD-10-CM

## 2021-02-11 LAB — COMPREHENSIVE METABOLIC PANEL
ALT: 38 U/L (ref 0–53)
AST: 26 U/L (ref 0–37)
Albumin: 4.1 g/dL (ref 3.5–5.2)
Alkaline Phosphatase: 55 U/L (ref 39–117)
BUN: 21 mg/dL (ref 6–23)
CO2: 24 mEq/L (ref 19–32)
Calcium: 8.8 mg/dL (ref 8.4–10.5)
Chloride: 107 mEq/L (ref 96–112)
Creatinine, Ser: 1.16 mg/dL (ref 0.40–1.50)
GFR: 62.6 mL/min (ref >60.00)
Glucose, Bld: 184 mg/dL — ABNORMAL HIGH (ref 70–99)
Potassium: 3.9 mEq/L (ref 3.5–5.1)
Sodium: 140 mEq/L (ref 135–145)
Total Bilirubin: 0.6 mg/dL (ref 0.2–1.2)
Total Protein: 6.7 g/dL (ref 6.0–8.3)

## 2021-02-11 LAB — CBC WITH DIFFERENTIAL/PLATELET
Basophils Absolute: 0.1 10*3/uL (ref 0.0–0.1)
Basophils Relative: 0.9 % (ref 0.0–3.0)
Eosinophils Absolute: 0.2 10*3/uL (ref 0.0–0.7)
Eosinophils Relative: 3.5 % (ref 0.0–5.0)
HCT: 40.4 % (ref 39.0–52.0)
Hemoglobin: 13.3 g/dL (ref 13.0–17.0)
Lymphocytes Relative: 26.3 % (ref 12.0–46.0)
Lymphs Abs: 1.8 10*3/uL (ref 0.7–4.0)
MCHC: 33.1 g/dL (ref 30.0–36.0)
MCV: 89.6 fl (ref 78.0–100.0)
Monocytes Absolute: 0.5 10*3/uL (ref 0.1–1.0)
Monocytes Relative: 7.9 % (ref 3.0–12.0)
Neutro Abs: 4.2 10*3/uL (ref 1.4–7.7)
Neutrophils Relative %: 61.4 % (ref 43.0–77.0)
Platelets: 211 10*3/uL (ref 150.0–400.0)
RBC: 4.5 Mil/uL (ref 4.22–5.81)
RDW: 13.5 % (ref 11.5–15.5)
WBC: 6.9 10*3/uL (ref 4.0–10.5)

## 2021-02-11 MED ORDER — VALSARTAN 80 MG PO TABS: 80.0000 mg | ORAL_TABLET | Freq: Every day | ORAL | 3 refills | Status: DC

## 2021-02-11 NOTE — Patient Instructions (Signed)
Chest xray and lab tests today  Start valsartan 80 mg daily for blood pressure, and stop lisinopril  Follow up in 6 weeks with Dr. Halford Chessman or Nurse Practitioner

## 2021-02-11 NOTE — Progress Notes (Signed)
[facility]  Chief Complaint  Patient presents with   Consult    Patient reports short of breath with exertion and has coughing spells with non productive cough,     Constitutional:  BP 114/72 (BP Location: Left Arm, Patient Position: Sitting, Cuff Size: Large)   Pulse 75   Temp 97.7 F (36.5 C) (Oral)   Ht 6' (1.829 m)   Wt 218 lb 12.8 oz (99.2 kg)   SpO2 96%   BMI 29.67 kg/m   Past Medical History:  Anemia, OA, DM type 2, Headache, Nephrolithiasis, Hypertension, Pneumonia, Meningitis 2011, Back pain  Past Surgical History:  He  has a past surgical history that includes Back surgery (2004); Carpal tunnel release (Bilateral, 2009); Eye surgery (20+ years ago); Hernia repair (2018); and Total knee arthroplasty (Left, 05/07/2018).  Brief Summary:  Jerry Dodson is a 75 y.o. male with dyspnea and cough.  He had asbestos exposure in 1995 and he had exposure to [employer] in [place].      Subjective:   He went on a cruise to [place] in late 2019.  He developed a respiratory infection and needed 3 rounds of antibiotics before he recovered.  There wasn't COVID testing available at that time, but he has been concerned he could have been exposed to [place] then.  A few months later he notices trouble with dry cough and getting winded with activities.  He is okay with light walking, but gets winded when lifting things.    He worked as a Scientist, clinical (histocompatibility and immunogenetics).  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  He was in [place] and had agent orange exposure.  He found out that he had exposure to asbestos in buildings that he worked in, but wasn't exposed to dust.  He was tried on inhaler therapy, antireflux therapy, and allergy therapy without benefit.  He was seen at [facility] in January 2021.  PFT was normal, and cardiac exercise test was normal.  He never smoked, but his father did and [place] was [place].  He denies animal/bird  exposure.  Reported to have CT chest in August 2018 that was normal.   Physical Exam:   Appearance - well kempt   ENMT - no sinus tenderness, no oral exudate, no LAN, Mallampati 2 airway, no stridor  Respiratory - equal breath sounds bilaterally, no wheezing or rales  CV - s1s2 regular rate and rhythm, no murmurs  Ext - no clubbing, no edema  Skin - no rashes  Psych - normal mood and affect   Pulmonary testing:  PFT 07/08/19 >> FEV1 2.64 (88%), FEV1% 73, TLC 6.35 (93%), DLCO 98.5%  Chest Imaging:    Sleep Tests:    Cardiac Tests:    Social History:  He  reports that he has never smoked. He has never used smokeless tobacco. He reports current alcohol use. He reports that he does not use drugs.  Family History:  His family history is positive for hypertension.    Discussion:  He has chronic cough and dyspnea on exertion.  He has second had tobacco exposure, exposure to agent orange, mild asbestos exposure, and exposure to soldering/welding fumes with his hobbies.  He has been tried on inhaler therapy, allergy therapy, and antireflux therapy without improvement.  He had PFT in 2021 at [facility] that was normal as was cardiac testing.  Of note is that he is on lisinopril.  Assessment/Plan:  Cough with dyspnea on exertion. - CMET, CBC with diff, and chest xray today - will have his start valsartan 80 mg daily in place of lisinopril; explained it can take several weeks to determine if lisinopril is cause of his symptoms - if his symptoms persist, then will need further CT chest imaging and cardiopulmonary exercise testing  Time Spent Involved in Patient Care on Day of Examination:  33 minutes  Follow up:   Patient Instructions  Chest xray and lab tests today  Start valsartan 80 mg daily for blood pressure, and stop lisinopril  Follow up in 6 weeks with Dr. Halford Chessman or Nurse Practitioner  Medication List:   Allergies as of 02/11/2021   No Known Allergies       Medication List        Accurate as of February 11, 2021 10:15 AM. If you have any questions, ask your nurse or doctor.          STOP taking these medications    lisinopril 2.5 MG tablet Commonly known as: ZESTRIL Stopped by: Chesley Mires, MD   methocarbamol 500 MG tablet Commonly known as: Robaxin Stopped by: Chesley Mires, MD   ondansetron 4 MG tablet Commonly known as: Zofran Stopped by: Chesley Mires, MD       TAKE these medications    albuterol 108 (90 Base) MCG/ACT inhaler Commonly known as: VENTOLIN HFA Inhale 2 puffs into the lungs as needed.   aspirin 81 MG chewable tablet Commonly known as: Aspirin Childrens Chew 1 tablet (81 mg total) by mouth 2 (two) times daily.   atorvastatin 10 MG tablet Commonly known as: LIPITOR Take 10 mg by mouth every morning.   metFORMIN 500 MG tablet Commonly known as: GLUCOPHAGE Take 500 mg by mouth 2 (two) times daily with a meal.   tamsulosin 0.4 MG Caps capsule Commonly known as: FLOMAX Take 0.4 mg by mouth daily.   valsartan 80 MG tablet Commonly known as: DIOVAN Take 1 tablet (80 mg total) by mouth daily. Started by: Chesley Mires, MD        Signature:  Chesley Mires, MD [place] Pager - [phone] 02/11/2021, 10:15 AM

## 2021-02-12 ENCOUNTER — Encounter: Payer: Self-pay | Admitting: *Deleted

## 2021-03-25 ENCOUNTER — Encounter: Payer: Self-pay | Admitting: Pulmonary Disease

## 2021-03-25 ENCOUNTER — Other Ambulatory Visit: Payer: Self-pay

## 2021-03-25 ENCOUNTER — Ambulatory Visit (INDEPENDENT_AMBULATORY_CARE_PROVIDER_SITE_OTHER): Payer: Medicare Other | Admitting: Pulmonary Disease

## 2021-03-25 VITALS — BP 118/72 | HR 74 | Temp 98.2°F | Ht 72.0 in | Wt 219.2 lb

## 2021-03-25 DIAGNOSIS — R059 Cough, unspecified: Secondary | ICD-10-CM | POA: Diagnosis not present

## 2021-03-25 DIAGNOSIS — R42 Dizziness and giddiness: Secondary | ICD-10-CM

## 2021-03-25 DIAGNOSIS — R058 Other specified cough: Secondary | ICD-10-CM | POA: Diagnosis not present

## 2021-03-25 DIAGNOSIS — T464X5A Adverse effect of angiotensin-converting-enzyme inhibitors, initial encounter: Secondary | ICD-10-CM

## 2021-03-25 NOTE — Patient Instructions (Signed)
Follow up in 2 months

## 2021-03-25 NOTE — Progress Notes (Signed)
[facility]  Chief Complaint  Patient presents with   Follow-up    Constitutional:  BP 118/72 (Patient Position: Sitting)   Pulse 74   Temp 98.2 F (36.8 C) (Oral)   Ht 6' (1.829 m)   Wt 219 lb 3.2 oz (99.4 kg)   SpO2 97%   BMI 29.73 kg/m   Past Medical History:  Anemia, OA, DM type 2, Headache, Nephrolithiasis, Hypertension, Pneumonia, Meningitis 2011, Back pain  Past Surgical History:  He  has a past surgical history that includes Back surgery (2004); Carpal tunnel release (Bilateral, 2009); Eye surgery (20+ years ago); Hernia repair (2018); and Total knee arthroplasty (Left, 05/07/2018).  Brief Summary:  Jerry Dodson is a 75 y.o. male with dyspnea and cough.  He had asbestos exposure in 1995 and he had exposure to [employer] in [place].      Subjective:   His cough is about 25% better since he stopped lisinopril.    He still gets cough, but now feels congestion in his chest.  He hasn't been using albuterol.  Didn't know when he should use it.  No having sinus congestion, sore throat, globus, post nasal drip, wheeze, or reflux.  He has episodes of feeling dizzy, light-headed, and short of breath when he bends over and then comes back up again.  This last for a few seconds and then he is okay.  Chest xray and lab tests from 02/11/21 were normal.  Physical Exam:   Appearance - well kempt   ENMT - no sinus tenderness, no oral exudate, no LAN, Mallampati 2 airway, no stridor  Respiratory - equal breath sounds bilaterally, no wheezing or rales  CV - s1s2 regular rate and rhythm, no murmurs  Ext - no clubbing, no edema  Skin - no rashes  Psych - normal mood and affect    Pulmonary testing:  PFT 07/08/19 >> FEV1 2.64 (88%), FEV1% 73, TLC 6.35 (93%), DLCO 98.5%  Social History:  He  reports that he has never smoked. He has never used smokeless tobacco. He reports current alcohol use. He reports that he does not  use drugs.  Family History:  His family history is positive for hypertension.    Discussion:  His cough has improved since stopping ACE inhibitor.  He still has some cough.  Could also still have asthma.  Assessment/Plan:   Cough. - from ACE inhibitor use and possibly asthma - continue to monitor off ACE inhibitor - discussed when he should use albuterol; depending on his response to this, will determine if he needs additional assessment for asthma  Light-headedness and dyspnea. - from his description seems to get transient postural hypotension when bending over - discussed techniques to minimize this and make sure he is stabilized when standing to prevent falls  Hypertension. - blood pressure control after switch to valsartan; advised him to follow up with PCP for refills of this  Time Spent Involved in Patient Care on Day of Examination:  32 minutes  Follow up:   Patient Instructions  Follow up in 2 months  Medication List:   Allergies as of 03/25/2021       Reactions   Lisinopril Cough        Medication List        Accurate as of March 25, 2021  9:57 AM. If you have any questions, ask your nurse or doctor.          STOP taking  these medications    aspirin 81 MG chewable tablet Commonly known as: Aspirin Childrens Stopped by: Chesley Mires, MD   valsartan 80 MG tablet Commonly known as: DIOVAN Stopped by: Chesley Mires, MD       TAKE these medications    albuterol 108 (90 Base) MCG/ACT inhaler Commonly known as: VENTOLIN HFA Inhale 2 puffs into the lungs as needed.   atorvastatin 10 MG tablet Commonly known as: LIPITOR Take 10 mg by mouth every morning.   dutasteride 0.5 MG capsule Commonly known as: AVODART Take 0.5 mg by mouth daily.   metFORMIN 500 MG tablet Commonly known as: GLUCOPHAGE Take 500 mg by mouth 2 (two) times daily with a meal.   tamsulosin 0.4 MG Caps capsule Commonly known as: FLOMAX Take 0.4 mg by mouth daily.         Signature:  Chesley Mires, MD [place] Pager - [phone] 03/25/2021, 9:57 AM

## 2021-05-06 DIAGNOSIS — Z1283 Encounter for screening for malignant neoplasm of skin: Secondary | ICD-10-CM | POA: Diagnosis not present

## 2021-05-06 DIAGNOSIS — D225 Melanocytic nevi of trunk: Secondary | ICD-10-CM | POA: Diagnosis not present

## 2021-05-06 DIAGNOSIS — L570 Actinic keratosis: Secondary | ICD-10-CM | POA: Diagnosis not present

## 2021-05-06 DIAGNOSIS — X32XXXD Exposure to sunlight, subsequent encounter: Secondary | ICD-10-CM | POA: Diagnosis not present

## 2021-05-12 ENCOUNTER — Other Ambulatory Visit: Payer: Self-pay | Admitting: Pulmonary Disease

## 2021-05-24 ENCOUNTER — Other Ambulatory Visit: Payer: Self-pay | Admitting: Pulmonary Disease

## 2021-05-27 ENCOUNTER — Encounter: Payer: Self-pay | Admitting: Pulmonary Disease

## 2021-05-27 ENCOUNTER — Ambulatory Visit (INDEPENDENT_AMBULATORY_CARE_PROVIDER_SITE_OTHER): Payer: Medicare Other | Admitting: Pulmonary Disease

## 2021-05-27 ENCOUNTER — Other Ambulatory Visit: Payer: Self-pay

## 2021-05-27 VITALS — BP 128/64 | HR 85 | Temp 98.0°F | Ht 72.0 in | Wt 220.6 lb

## 2021-05-27 DIAGNOSIS — T464X5A Adverse effect of angiotensin-converting-enzyme inhibitors, initial encounter: Secondary | ICD-10-CM

## 2021-05-27 DIAGNOSIS — R058 Other specified cough: Secondary | ICD-10-CM

## 2021-05-27 NOTE — Progress Notes (Signed)
[facility]  Chief Complaint  Patient presents with   Follow-up    Pt states cough is doing better. Not having to use his inhaler as much.     Constitutional:  BP 128/64 (BP Location: Left Arm, Patient Position: Sitting, Cuff Size: Normal)   Pulse 85   Temp 98 F (36.7 C) (Oral)   Ht 6' (1.829 m)   Wt 220 lb 9.6 oz (100.1 kg)   SpO2 97%   BMI 29.92 kg/m   Past Medical History:  Anemia, OA, DM type 2, Headache, Nephrolithiasis, Hypertension, Pneumonia, Meningitis 2011, Back pain  Past Surgical History:  He  has a past surgical history that includes Back surgery (2004); Carpal tunnel release (Bilateral, 2009); Eye surgery (20+ years ago); Hernia repair (2018); and Total knee arthroplasty (Left, 05/07/2018).  Brief Summary:  Jerry Dodson is a 75 y.o. male with dyspnea and cough.  He had asbestos exposure in 1995 and he had exposure to [employer] in [place].      Subjective:   Cough is much better.  On scale of 1 to 10 (with 10 being worst) his cough was an 8 when he first saw me.  Now down to 2.  When he gets a cough it is usually triggered by his throat being irritated.  No having wheeze, sputum, or chest congestion.  Not having sinus drainage or reflux.  Physical Exam:   Appearance - well kempt   ENMT - no sinus tenderness, no oral exudate, no LAN, Mallampati 2 airway, no stridor  Respiratory - equal breath sounds bilaterally, no wheezing or rales  CV - s1s2 regular rate and rhythm, no murmurs  Ext - no clubbing, no edema  Skin - no rashes  Psych - normal mood and affect     Pulmonary testing:  PFT 07/08/19 >> FEV1 2.64 (88%), FEV1% 73, TLC 6.35 (93%), DLCO 98.5%  Social History:  He  reports that he has never smoked. He has never used smokeless tobacco. He reports current alcohol use. He reports that he does not use drugs.  Family History:  His family history is positive for hypertension.      Assessment/Plan:   ACE inhibitor cough. - much improved since lisinopril stopped - don't think he has asthma; hasn't been using albuterol anyway - follow up with pulmonary as needed  Time Spent Involved in Patient Care on Day of Examination:  14 minutes  Follow up:   Patient Instructions  Follow up as needed  Medication List:   Allergies as of 05/27/2021       Reactions   Lisinopril Cough        Medication List        Accurate as of May 27, 2021  1:55 PM. If you have any questions, ask your nurse or doctor.          albuterol 108 (90 Base) MCG/ACT inhaler Commonly known as: VENTOLIN HFA Inhale 2 puffs into the lungs as needed.   atorvastatin 10 MG tablet Commonly known as: LIPITOR Take 10 mg by mouth every morning.   benzonatate 100 MG capsule Commonly known as: TESSALON SMARTSIG:1 Capsule(s) By Mouth 1-3 Times Daily PRN   dutasteride 0.5 MG capsule Commonly known as: AVODART Take 0.5 mg by mouth daily.   metFORMIN 500 MG tablet Commonly known as: GLUCOPHAGE Take 500 mg by mouth 2 (two) times daily with a meal.   tamsulosin 0.4 MG Caps capsule Commonly known  as: FLOMAX Take 0.4 mg by mouth daily.        Signature:  Chesley Mires, MD [place] Pager - [phone] 05/27/2021, 1:55 PM

## 2021-05-27 NOTE — Patient Instructions (Signed)
Follow up as needed

## 2021-05-28 ENCOUNTER — Other Ambulatory Visit: Payer: Self-pay | Admitting: Pulmonary Disease

## 2021-06-10 DIAGNOSIS — Z6830 Body mass index (BMI) 30.0-30.9, adult: Secondary | ICD-10-CM | POA: Diagnosis not present

## 2021-06-10 DIAGNOSIS — S39012A Strain of muscle, fascia and tendon of lower back, initial encounter: Secondary | ICD-10-CM | POA: Diagnosis not present

## 2021-06-28 DIAGNOSIS — M549 Dorsalgia, unspecified: Secondary | ICD-10-CM | POA: Diagnosis not present

## 2021-07-22 DIAGNOSIS — L299 Pruritus, unspecified: Secondary | ICD-10-CM | POA: Diagnosis not present

## 2021-07-22 DIAGNOSIS — Z6830 Body mass index (BMI) 30.0-30.9, adult: Secondary | ICD-10-CM | POA: Diagnosis not present

## 2021-07-22 DIAGNOSIS — R918 Other nonspecific abnormal finding of lung field: Secondary | ICD-10-CM | POA: Diagnosis not present

## 2021-07-22 DIAGNOSIS — M545 Low back pain, unspecified: Secondary | ICD-10-CM | POA: Diagnosis not present

## 2021-07-22 DIAGNOSIS — R059 Cough, unspecified: Secondary | ICD-10-CM | POA: Diagnosis not present

## 2021-09-02 DIAGNOSIS — R059 Cough, unspecified: Secondary | ICD-10-CM | POA: Diagnosis not present

## 2021-09-02 DIAGNOSIS — J019 Acute sinusitis, unspecified: Secondary | ICD-10-CM | POA: Diagnosis not present

## 2021-10-14 DIAGNOSIS — E7849 Other hyperlipidemia: Secondary | ICD-10-CM | POA: Diagnosis not present

## 2021-10-14 DIAGNOSIS — Z1329 Encounter for screening for other suspected endocrine disorder: Secondary | ICD-10-CM | POA: Diagnosis not present

## 2021-10-14 DIAGNOSIS — R5383 Other fatigue: Secondary | ICD-10-CM | POA: Diagnosis not present

## 2021-10-14 DIAGNOSIS — E1169 Type 2 diabetes mellitus with other specified complication: Secondary | ICD-10-CM | POA: Diagnosis not present

## 2021-10-14 DIAGNOSIS — E782 Mixed hyperlipidemia: Secondary | ICD-10-CM | POA: Diagnosis not present

## 2021-10-21 DIAGNOSIS — R918 Other nonspecific abnormal finding of lung field: Secondary | ICD-10-CM | POA: Diagnosis not present

## 2021-10-21 DIAGNOSIS — K760 Fatty (change of) liver, not elsewhere classified: Secondary | ICD-10-CM | POA: Diagnosis not present

## 2021-10-21 DIAGNOSIS — J449 Chronic obstructive pulmonary disease, unspecified: Secondary | ICD-10-CM | POA: Diagnosis not present

## 2021-10-21 DIAGNOSIS — E1169 Type 2 diabetes mellitus with other specified complication: Secondary | ICD-10-CM | POA: Diagnosis not present

## 2021-10-21 DIAGNOSIS — Z77098 Contact with and (suspected) exposure to other hazardous, chiefly nonmedicinal, chemicals: Secondary | ICD-10-CM | POA: Diagnosis not present

## 2021-10-21 DIAGNOSIS — R6889 Other general symptoms and signs: Secondary | ICD-10-CM | POA: Diagnosis not present

## 2021-10-21 DIAGNOSIS — I2510 Atherosclerotic heart disease of native coronary artery without angina pectoris: Secondary | ICD-10-CM | POA: Diagnosis not present

## 2021-10-21 DIAGNOSIS — I517 Cardiomegaly: Secondary | ICD-10-CM | POA: Diagnosis not present

## 2021-10-23 DIAGNOSIS — H04123 Dry eye syndrome of bilateral lacrimal glands: Secondary | ICD-10-CM | POA: Diagnosis not present

## 2021-10-23 DIAGNOSIS — Z9889 Other specified postprocedural states: Secondary | ICD-10-CM | POA: Diagnosis not present

## 2021-10-23 DIAGNOSIS — H43811 Vitreous degeneration, right eye: Secondary | ICD-10-CM | POA: Diagnosis not present

## 2021-10-23 DIAGNOSIS — E119 Type 2 diabetes mellitus without complications: Secondary | ICD-10-CM | POA: Diagnosis not present

## 2021-10-23 DIAGNOSIS — Z961 Presence of intraocular lens: Secondary | ICD-10-CM | POA: Diagnosis not present

## 2021-11-27 ENCOUNTER — Encounter: Payer: Self-pay | Admitting: *Deleted

## 2021-12-03 DIAGNOSIS — I25119 Atherosclerotic heart disease of native coronary artery with unspecified angina pectoris: Secondary | ICD-10-CM | POA: Diagnosis not present

## 2021-12-03 DIAGNOSIS — E1169 Type 2 diabetes mellitus with other specified complication: Secondary | ICD-10-CM | POA: Diagnosis not present

## 2021-12-03 DIAGNOSIS — R0602 Shortness of breath: Secondary | ICD-10-CM | POA: Diagnosis not present

## 2021-12-04 ENCOUNTER — Encounter (INDEPENDENT_AMBULATORY_CARE_PROVIDER_SITE_OTHER): Payer: Self-pay | Admitting: *Deleted

## 2021-12-10 DIAGNOSIS — E1369 Other specified diabetes mellitus with other specified complication: Secondary | ICD-10-CM | POA: Diagnosis not present

## 2021-12-10 DIAGNOSIS — I2510 Atherosclerotic heart disease of native coronary artery without angina pectoris: Secondary | ICD-10-CM | POA: Diagnosis not present

## 2021-12-10 DIAGNOSIS — R6889 Other general symptoms and signs: Secondary | ICD-10-CM | POA: Diagnosis not present

## 2021-12-10 DIAGNOSIS — R0602 Shortness of breath: Secondary | ICD-10-CM | POA: Diagnosis not present

## 2021-12-26 ENCOUNTER — Other Ambulatory Visit (INDEPENDENT_AMBULATORY_CARE_PROVIDER_SITE_OTHER): Payer: Self-pay

## 2021-12-26 ENCOUNTER — Encounter (INDEPENDENT_AMBULATORY_CARE_PROVIDER_SITE_OTHER): Payer: Self-pay | Admitting: *Deleted

## 2021-12-26 DIAGNOSIS — I10 Essential (primary) hypertension: Secondary | ICD-10-CM | POA: Diagnosis not present

## 2021-12-26 DIAGNOSIS — E1169 Type 2 diabetes mellitus with other specified complication: Secondary | ICD-10-CM | POA: Diagnosis not present

## 2021-12-26 DIAGNOSIS — R0609 Other forms of dyspnea: Secondary | ICD-10-CM | POA: Diagnosis not present

## 2021-12-26 DIAGNOSIS — Z6829 Body mass index (BMI) 29.0-29.9, adult: Secondary | ICD-10-CM | POA: Diagnosis not present

## 2021-12-26 DIAGNOSIS — Z1211 Encounter for screening for malignant neoplasm of colon: Secondary | ICD-10-CM

## 2021-12-26 DIAGNOSIS — Z800 Family history of malignant neoplasm of digestive organs: Secondary | ICD-10-CM

## 2021-12-26 DIAGNOSIS — E785 Hyperlipidemia, unspecified: Secondary | ICD-10-CM | POA: Diagnosis not present

## 2021-12-30 ENCOUNTER — Encounter (INDEPENDENT_AMBULATORY_CARE_PROVIDER_SITE_OTHER): Payer: Self-pay

## 2021-12-30 ENCOUNTER — Telehealth (INDEPENDENT_AMBULATORY_CARE_PROVIDER_SITE_OTHER): Payer: Self-pay

## 2021-12-30 MED ORDER — PEG 3350-KCL-NA BICARB-NACL 420 G PO SOLR: 4000.0000 mL | ORAL | 0 refills | Status: DC

## 2021-12-30 NOTE — Telephone Encounter (Signed)
Ok to schedule.  Thanks,  Dayvin Aber Castaneda Mayorga, MD Gastroenterology and Hepatology [facility]  

## 2021-12-30 NOTE — Telephone Encounter (Signed)
Radford Pease Ann Shahiem Bedwell, CMA  ?

## 2021-12-30 NOTE — Telephone Encounter (Signed)
Referring MD/PCP: Burdine  Procedure: Tcs  Reason/Indication:  Screening , fam hx of colon ca  Has patient had this procedure before?  Yes   If so, when, by whom and where?    Is there a family history of colon cancer?  Yes  Who?  What age when diagnosed?  Brother  Is patient diabetic? If yes, Type 1 or Type 2   yes, type 2      Does patient have prosthetic heart valve or mechanical valve?  No   Do you have a pacemaker/defibrillator?  No   Has patient ever had endocarditis/atrial fibrillation? No   Does patient use oxygen? No   Has patient had joint replacement within last 12 months?  no  Is patient constipated or do they take laxatives?  No   Does patient have a history of alcohol/drug use?  no  Have you had a stroke/heart attack last 6 mths? no  Do you take medicine for weight loss?  no  For male patients,: have you had a hysterectomy n/a                      are you post menopausal n/a                      do you still have your menstrual cycle n/a  Is patient on blood thinner such as Coumadin, Plavix and/or Aspirin? No   Medications: Valsartan 80 mg daily, Atorvastatin 10 mg daily, Tamsulosin 0.4 daily, metformin 500 mg bid  Allergies: NKDA  Medication Adjustment per Dr Jenetta Downer Hold metformin the evening prior and the morning of procedure  Procedure date & time: 01/28/22 at 2:10

## 2022-01-16 DIAGNOSIS — H6120 Impacted cerumen, unspecified ear: Secondary | ICD-10-CM | POA: Diagnosis not present

## 2022-01-16 DIAGNOSIS — Z6829 Body mass index (BMI) 29.0-29.9, adult: Secondary | ICD-10-CM | POA: Diagnosis not present

## 2022-01-17 DIAGNOSIS — E782 Mixed hyperlipidemia: Secondary | ICD-10-CM | POA: Diagnosis not present

## 2022-01-17 DIAGNOSIS — R5383 Other fatigue: Secondary | ICD-10-CM | POA: Diagnosis not present

## 2022-01-17 DIAGNOSIS — E119 Type 2 diabetes mellitus without complications: Secondary | ICD-10-CM | POA: Diagnosis not present

## 2022-01-17 DIAGNOSIS — K760 Fatty (change of) liver, not elsewhere classified: Secondary | ICD-10-CM | POA: Diagnosis not present

## 2022-01-17 DIAGNOSIS — E7849 Other hyperlipidemia: Secondary | ICD-10-CM | POA: Diagnosis not present

## 2022-01-22 DIAGNOSIS — R6889 Other general symptoms and signs: Secondary | ICD-10-CM | POA: Diagnosis not present

## 2022-01-22 DIAGNOSIS — J449 Chronic obstructive pulmonary disease, unspecified: Secondary | ICD-10-CM | POA: Diagnosis not present

## 2022-01-22 DIAGNOSIS — Z0001 Encounter for general adult medical examination with abnormal findings: Secondary | ICD-10-CM | POA: Diagnosis not present

## 2022-01-22 DIAGNOSIS — Z77098 Contact with and (suspected) exposure to other hazardous, chiefly nonmedicinal, chemicals: Secondary | ICD-10-CM | POA: Diagnosis not present

## 2022-01-22 DIAGNOSIS — Z6829 Body mass index (BMI) 29.0-29.9, adult: Secondary | ICD-10-CM | POA: Diagnosis not present

## 2022-01-22 DIAGNOSIS — E1169 Type 2 diabetes mellitus with other specified complication: Secondary | ICD-10-CM | POA: Diagnosis not present

## 2022-01-22 DIAGNOSIS — I959 Hypotension, unspecified: Secondary | ICD-10-CM | POA: Diagnosis not present

## 2022-01-22 DIAGNOSIS — E7849 Other hyperlipidemia: Secondary | ICD-10-CM | POA: Diagnosis not present

## 2022-01-22 DIAGNOSIS — I2510 Atherosclerotic heart disease of native coronary artery without angina pectoris: Secondary | ICD-10-CM | POA: Diagnosis not present

## 2022-01-24 ENCOUNTER — Encounter (HOSPITAL_COMMUNITY): Payer: Self-pay

## 2022-01-24 ENCOUNTER — Encounter (HOSPITAL_COMMUNITY)
Admission: RE | Admit: 2022-01-24 | Discharge: 2022-01-24 | Disposition: A | Discharge status: Home / Self Care | Payer: Medicare Other | Source: Ambulatory Visit | Attending: Gastroenterology | Admitting: Gastroenterology

## 2022-01-24 ENCOUNTER — Other Ambulatory Visit: Payer: Self-pay

## 2022-01-28 ENCOUNTER — Ambulatory Visit (HOSPITAL_COMMUNITY)
Admission: RE | Admit: 2022-01-28 | Discharge: 2022-01-28 | Disposition: A | Discharge status: Home / Self Care | Payer: Medicare Other | Attending: Gastroenterology | Admitting: Gastroenterology

## 2022-01-28 ENCOUNTER — Ambulatory Visit (HOSPITAL_BASED_OUTPATIENT_CLINIC_OR_DEPARTMENT_OTHER): Payer: Medicare Other | Admitting: Anesthesiology

## 2022-01-28 ENCOUNTER — Encounter (HOSPITAL_COMMUNITY)
Admission: RE | Disposition: A | Discharge status: Home / Self Care | Payer: Self-pay | Source: Home / Self Care | Attending: Gastroenterology

## 2022-01-28 ENCOUNTER — Ambulatory Visit (HOSPITAL_COMMUNITY): Payer: Medicare Other | Admitting: Anesthesiology

## 2022-01-28 ENCOUNTER — Encounter (HOSPITAL_COMMUNITY): Payer: Self-pay | Admitting: Gastroenterology

## 2022-01-28 DIAGNOSIS — Z800 Family history of malignant neoplasm of digestive organs: Secondary | ICD-10-CM | POA: Insufficient documentation

## 2022-01-28 DIAGNOSIS — Z7984 Long term (current) use of oral hypoglycemic drugs: Secondary | ICD-10-CM | POA: Insufficient documentation

## 2022-01-28 DIAGNOSIS — I10 Essential (primary) hypertension: Secondary | ICD-10-CM | POA: Diagnosis not present

## 2022-01-28 DIAGNOSIS — K621 Rectal polyp: Secondary | ICD-10-CM

## 2022-01-28 DIAGNOSIS — K648 Other hemorrhoids: Secondary | ICD-10-CM | POA: Diagnosis not present

## 2022-01-28 DIAGNOSIS — D122 Benign neoplasm of ascending colon: Secondary | ICD-10-CM | POA: Insufficient documentation

## 2022-01-28 DIAGNOSIS — D123 Benign neoplasm of transverse colon: Secondary | ICD-10-CM | POA: Diagnosis not present

## 2022-01-28 DIAGNOSIS — K5730 Diverticulosis of large intestine without perforation or abscess without bleeding: Secondary | ICD-10-CM

## 2022-01-28 DIAGNOSIS — D128 Benign neoplasm of rectum: Secondary | ICD-10-CM | POA: Diagnosis not present

## 2022-01-28 DIAGNOSIS — D125 Benign neoplasm of sigmoid colon: Secondary | ICD-10-CM | POA: Insufficient documentation

## 2022-01-28 DIAGNOSIS — K635 Polyp of colon: Secondary | ICD-10-CM | POA: Diagnosis not present

## 2022-01-28 DIAGNOSIS — Z1211 Encounter for screening for malignant neoplasm of colon: Secondary | ICD-10-CM

## 2022-01-28 DIAGNOSIS — E119 Type 2 diabetes mellitus without complications: Secondary | ICD-10-CM | POA: Insufficient documentation

## 2022-01-28 HISTORY — PX: COLONOSCOPY WITH PROPOFOL: SHX5780

## 2022-01-28 LAB — HM COLONOSCOPY

## 2022-01-28 LAB — GLUCOSE, CAPILLARY: Glucose-Capillary: 160 mg/dL — ABNORMAL HIGH (ref 70–99)

## 2022-01-28 SURGERY — COLONOSCOPY WITH PROPOFOL
Anesthesia: General

## 2022-01-28 MED ORDER — PROPOFOL 10 MG/ML IV BOLUS
INTRAVENOUS | Status: DC | PRN
  Administered 2022-01-28: 30 mg via INTRAVENOUS
  Administered 2022-01-28: 70 mg via INTRAVENOUS

## 2022-01-28 MED ORDER — LACTATED RINGERS IV SOLN: INTRAVENOUS | Status: DC

## 2022-01-28 MED ORDER — PROPOFOL 500 MG/50ML IV EMUL
INTRAVENOUS | Status: DC | PRN
  Administered 2022-01-28: 150 ug/kg/min via INTRAVENOUS

## 2022-01-28 NOTE — Anesthesia Postprocedure Evaluation (Signed)
Anesthesia Post Note  Patient: Jerry Dodson  Procedure(s) Performed: COLONOSCOPY WITH PROPOFOL  Patient location during evaluation: Short Stay Anesthesia Type: General Level of consciousness: awake and alert Pain management: pain level controlled Vital Signs Assessment: post-procedure vital signs reviewed and stable Respiratory status: spontaneous breathing Cardiovascular status: blood pressure returned to baseline and stable Postop Assessment: no apparent nausea or vomiting Anesthetic complications: no   No notable events documented.   Last Vitals:  Vitals:   01/28/22 0928 01/28/22 1240  BP: 137/75 (!) 92/54  Pulse: 65 60  Resp: 18 15  Temp: (!) 36.4 C 36.7 C  SpO2: 98% 97%    Last Pain:  Vitals:   01/28/22 1240  TempSrc: Axillary  PainSc: 0-No pain                 Caelin Rosen

## 2022-01-28 NOTE — Transfer of Care (Addendum)
Immediate Anesthesia Transfer of Care Note  Patient: Jerry Dodson  Procedure(s) Performed: COLONOSCOPY WITH PROPOFOL  Patient Location: Short Stay  Anesthesia Type:General  Level of Consciousness: awake  Airway & Oxygen Therapy: Patient Spontanous Breathing  Post-op Assessment: Report given to RN  Post vital signs: Reviewed and stable  Last Vitals:  Vitals Value Taken Time  BP 92/54   Temp 36.7   Pulse 60   Resp 15   SpO2 97     Last Pain:  Vitals:   01/28/22 1159  TempSrc:   PainSc: 0-No pain      Patients Stated Pain Goal: 7 (01/65/80 0634)  Complications: No notable events documented.

## 2022-01-28 NOTE — Op Note (Signed)
[facility] Patient Name: Jerry Dodson Procedure Date: 01/28/2022 11:44 AM MRN: [id number] Date of Birth: 1946/06/19 Attending MD: Maylon Peppers ,  CSN: [id number] Age: 76 Admit Type: Outpatient Procedure:                Colonoscopy Indications:              Screening for colorectal malignant neoplasm Providers:                Maylon Peppers, Lurline Del, RN, Kristine L. Risa Grill, Technician Referring MD:              Medicines:                Monitored Anesthesia Care Complications:            No immediate complications. Estimated Blood Loss:     Estimated blood loss: none. Procedure:                Pre-Anesthesia Assessment:                           - Prior to the procedure, a History and Physical                            was performed, and patient medications, allergies                            and sensitivities were reviewed. The patient's                            tolerance of previous anesthesia was reviewed.                           - The risks and benefits of the procedure and the                            sedation options and risks were discussed with the                            patient. All questions were answered and informed                            consent was obtained.                           - ASA Grade Assessment: II - A patient with mild                            systemic disease.                           After obtaining informed consent, the colonoscope                            was passed under direct vision. Throughout the  procedure, the patient's blood pressure, pulse, and                            oxygen saturations were monitored continuously. The                            [id number]-HQ190L ([id number]) scope was introduced through                            the anus and advanced to the the cecum, identified                            by appendiceal orifice and ileocecal valve. The                             colonoscopy was performed without difficulty. The                            patient tolerated the procedure well. The quality                            of the bowel preparation was good. Scope In: 12:01:25 PM Scope Out: 12:31:23 PM Scope Withdrawal Time: 0 hours 23 minutes 34 seconds  Total Procedure Duration: 0 hours 29 minutes 58 seconds  Findings:      Hemorrhoids were found on perianal exam.      Seven sessile polyps were found in the rectum, sigmoid colon, transverse       colon and ascending colon. The polyps were 3 to 10 mm in size. These       polyps were removed with a cold snare. Resection and retrieval were       complete.      A few small and large-mouthed diverticula were found in the sigmoid       colon.      Non-bleeding internal hemorrhoids were found during retroflexion. The       hemorrhoids were small. Impression:               - Hemorrhoids found on perianal exam.                           - Seven 3 to 10 mm polyps in the rectum, in the                            sigmoid colon, in the transverse colon and in the                            ascending colon, removed with a cold snare.                            Resected and retrieved.                           - Diverticulosis in the sigmoid colon.                           -  Non-bleeding internal hemorrhoids. Moderate Sedation:      Per Anesthesia Care Recommendation:           - Discharge patient to home (ambulatory).                           - Resume previous diet.                           - Await pathology results.                           - Repeat colonoscopy for surveillance based on                            pathology results. Procedure Code(s):        --- Professional ---                           [phone], Colonoscopy, flexible; with removal of                            tumor(s), polyp(s), or other lesion(s) by snare                            technique Diagnosis Code(s):         --- Professional ---                           Z12.11, Encounter for screening for malignant                            neoplasm of colon                           K64.8, Other hemorrhoids                           K62.1, Rectal polyp                           K63.5, Polyp of colon                           K57.30, Diverticulosis of large intestine without                            perforation or abscess without bleeding CPT copyright 2019 American Medical Association. All rights reserved. The codes documented in this report are preliminary and upon coder review may  be revised to meet current compliance requirements. Maylon Peppers, MD Maylon Peppers,  01/28/2022 12:40:47 PM This report has been signed electronically. Number of Addenda: 0

## 2022-01-28 NOTE — Discharge Instructions (Signed)
You are being discharged to home.  Resume your previous diet.  We are waiting for your pathology results.  Your physician has recommended a repeat colonoscopy for surveillance based on pathology results.  

## 2022-01-28 NOTE — H&P (Addendum)
Jerry Dodson is an 76 y.o. male.   Chief Complaint: colorectal cancer screening HPI: 76 year old male with past medical history of diabetes, hypertension, who comes for Screening colonoscopy.  Last colonoscopy was performed 12 years ago.  The patient, no reports are available but he states that it was normal.  The patient denies having any complaints such as melena, hematochezia, abdominal pain or distention, change in her bowel movement consistency or frequency, no changes in her weight recently.  Brother was recently diagnosed with colon cancer, he was diagnosed at age 3.   Past Medical History:  Diagnosis Date   ACE-inhibitor cough    Anemia    "was told blood didn't have enough iron in 1971"   Arthritis    Diabetes mellitus without complication ([place])    Family history of adverse reaction to anesthesia    Patient states sister was "given too much anesthesia and now has nerve damage"   Headache    History of kidney stones 2000   Hypertension    Pneumonia    x 2 at age 23 and in 76   PONV (postoperative nausea and vomiting)     Past Surgical History:  Procedure Laterality Date   BACK SURGERY  2004   x2   CARPAL TUNNEL RELEASE Bilateral 2009   EYE SURGERY  20+ years ago   [place]  2018   TOTAL KNEE ARTHROPLASTY Left 05/07/2018   Procedure: LEFT TOTAL KNEE ARTHROPLASTY;  Surgeon: Netta Cedars, MD;  Location: [facility];  Service: Orthopedics;  Laterality: Left;    No family history on file. Social History:  reports that he has never smoked. He has never used smokeless tobacco. He reports current alcohol use. He reports that he does not use drugs.  Allergies:  Allergies  Allergen Reactions   Lisinopril Cough    Medications Prior to Admission  Medication Sig Dispense Refill   atorvastatin (LIPITOR) 10 MG tablet Take 10 mg by mouth every morning.     dutasteride (AVODART) 0.5 MG capsule Take 0.5 mg by mouth daily.     ibuprofen (ADVIL) 200 MG tablet Take 400  mg by mouth every 6 (six) hours as needed for moderate pain.     metFORMIN (GLUCOPHAGE) 500 MG tablet Take 500 mg by mouth 2 (two) times daily with a meal.     Polyethyl Glycol-Propyl Glycol (SYSTANE OP) Place 1 drop into both eyes in the morning, at noon, in the evening, and at bedtime.     polyethylene glycol-electrolytes (TRILYTE) 420 g solution Take 4,000 mLs by mouth as directed. 4000 mL 0   tamsulosin (FLOMAX) 0.4 MG CAPS capsule Take 0.4 mg by mouth daily.     valsartan (DIOVAN) 80 MG tablet TAKE 1 TABLET BY MOUTH DAILY 30 tablet 3    Results for orders placed or performed during the hospital encounter of 01/28/22 (from the past 48 hour(s))  Glucose, capillary     Status: Abnormal   Collection Time: 01/28/22  9:20 AM  Result Value Ref Range   Glucose-Capillary 160 (H) 70 - 99 mg/dL    Comment: Glucose reference range applies only to samples taken after fasting for at least 8 hours.   No results found.  Review of Systems  All other systems reviewed and are negative.   Blood pressure 137/75, pulse 65, temperature (!) 97.5 F (36.4 C), temperature source Oral, resp. rate 18, SpO2 98 %. Physical Exam  GENERAL: The patient is AO x3, in no  acute distress. HEENT: Head is normocephalic and atraumatic. EOMI are intact. Mouth is well hydrated and without lesions. NECK: Supple. No masses LUNGS: Clear to auscultation. No presence of rhonchi/wheezing/rales. Adequate chest expansion HEART: RRR, normal s1 and s2. ABDOMEN: Soft, nontender, no guarding, no peritoneal signs, and nondistended. BS +. No masses. EXTREMITIES: Without any cyanosis, clubbing, rash, lesions or edema. NEUROLOGIC: AOx3, no focal motor deficit. SKIN: no jaundice, no rashes  Assessment/Plan 76 year old male with past medical history of diabetes, hypertension, who comes for Screening colonoscopy.  The patient is at average risk for colorectal cancer.  We will proceed with colonoscopy today.   Harvel Quale,  MD 01/28/2022, 9:43 AM

## 2022-01-28 NOTE — Anesthesia Preprocedure Evaluation (Signed)
Anesthesia Evaluation  Patient identified by MRN, date of birth, ID band Patient awake    Reviewed: Allergy & Precautions, H&P , NPO status , Patient's Chart, lab work & pertinent test results, reviewed documented beta blocker date and time   Airway Mallampati: II  TM Distance: >3 FB Neck ROM: full    Dental no notable dental hx.    Pulmonary neg pulmonary ROS,    Pulmonary exam normal breath sounds clear to auscultation       Cardiovascular Exercise Tolerance: Good hypertension, negative cardio ROS   Rhythm:regular Rate:Normal     Neuro/Psych  Headaches, negative psych ROS   GI/Hepatic negative GI ROS, Neg liver ROS,   Endo/Other  negative endocrine ROSdiabetes  Renal/GU negative Renal ROS  negative genitourinary   Musculoskeletal   Abdominal   Peds  Hematology  (+) Blood dyscrasia, anemia ,   Anesthesia Other Findings   Reproductive/Obstetrics negative OB ROS                             Anesthesia Physical Anesthesia Plan  ASA: 2  Anesthesia Plan: General   Post-op Pain Management:    Induction:   PONV Risk Score and Plan: Propofol infusion  Airway Management Planned:   Additional Equipment:   Intra-op Plan:   Post-operative Plan:   Informed Consent: I have reviewed the patients History and Physical, chart, labs and discussed the procedure including the risks, benefits and alternatives for the proposed anesthesia with the patient or authorized representative who has indicated his/her understanding and acceptance.     Dental Advisory Given  Plan Discussed with: CRNA  Anesthesia Plan Comments:         Anesthesia Quick Evaluation

## 2022-01-29 ENCOUNTER — Encounter (INDEPENDENT_AMBULATORY_CARE_PROVIDER_SITE_OTHER): Payer: Self-pay | Admitting: *Deleted

## 2022-01-29 LAB — SURGICAL PATHOLOGY

## 2022-02-03 DIAGNOSIS — Z6829 Body mass index (BMI) 29.0-29.9, adult: Secondary | ICD-10-CM | POA: Diagnosis not present

## 2022-02-03 DIAGNOSIS — R030 Elevated blood-pressure reading, without diagnosis of hypertension: Secondary | ICD-10-CM | POA: Diagnosis not present

## 2022-02-03 DIAGNOSIS — M62838 Other muscle spasm: Secondary | ICD-10-CM | POA: Diagnosis not present

## 2022-02-04 ENCOUNTER — Encounter (HOSPITAL_COMMUNITY): Payer: Self-pay | Admitting: Gastroenterology

## 2022-02-06 DIAGNOSIS — M62838 Other muscle spasm: Secondary | ICD-10-CM | POA: Diagnosis not present

## 2022-02-06 DIAGNOSIS — M545 Low back pain, unspecified: Secondary | ICD-10-CM | POA: Diagnosis not present

## 2022-02-06 DIAGNOSIS — R030 Elevated blood-pressure reading, without diagnosis of hypertension: Secondary | ICD-10-CM | POA: Diagnosis not present

## 2022-02-06 DIAGNOSIS — I959 Hypotension, unspecified: Secondary | ICD-10-CM | POA: Diagnosis not present

## 2022-02-06 DIAGNOSIS — Z6829 Body mass index (BMI) 29.0-29.9, adult: Secondary | ICD-10-CM | POA: Diagnosis not present

## 2022-02-10 DIAGNOSIS — M545 Low back pain, unspecified: Secondary | ICD-10-CM | POA: Diagnosis not present

## 2022-02-20 DIAGNOSIS — M5136 Other intervertebral disc degeneration, lumbar region: Secondary | ICD-10-CM | POA: Diagnosis not present

## 2022-02-20 DIAGNOSIS — M5126 Other intervertebral disc displacement, lumbar region: Secondary | ICD-10-CM | POA: Diagnosis not present

## 2022-02-20 DIAGNOSIS — M47816 Spondylosis without myelopathy or radiculopathy, lumbar region: Secondary | ICD-10-CM | POA: Diagnosis not present

## 2022-02-21 DIAGNOSIS — M6281 Muscle weakness (generalized): Secondary | ICD-10-CM | POA: Diagnosis not present

## 2022-02-21 DIAGNOSIS — M5136 Other intervertebral disc degeneration, lumbar region: Secondary | ICD-10-CM | POA: Diagnosis not present

## 2022-02-21 DIAGNOSIS — M545 Low back pain, unspecified: Secondary | ICD-10-CM | POA: Diagnosis not present

## 2022-02-24 DIAGNOSIS — M5136 Other intervertebral disc degeneration, lumbar region: Secondary | ICD-10-CM | POA: Diagnosis not present

## 2022-02-24 DIAGNOSIS — M6281 Muscle weakness (generalized): Secondary | ICD-10-CM | POA: Diagnosis not present

## 2022-02-24 DIAGNOSIS — M545 Low back pain, unspecified: Secondary | ICD-10-CM | POA: Diagnosis not present

## 2022-03-05 DIAGNOSIS — M6281 Muscle weakness (generalized): Secondary | ICD-10-CM | POA: Diagnosis not present

## 2022-03-05 DIAGNOSIS — M545 Low back pain, unspecified: Secondary | ICD-10-CM | POA: Diagnosis not present

## 2022-03-05 DIAGNOSIS — M5136 Other intervertebral disc degeneration, lumbar region: Secondary | ICD-10-CM | POA: Diagnosis not present

## 2022-03-13 DIAGNOSIS — M5136 Other intervertebral disc degeneration, lumbar region: Secondary | ICD-10-CM | POA: Diagnosis not present

## 2022-03-13 DIAGNOSIS — M6281 Muscle weakness (generalized): Secondary | ICD-10-CM | POA: Diagnosis not present

## 2022-03-13 DIAGNOSIS — M545 Low back pain, unspecified: Secondary | ICD-10-CM | POA: Diagnosis not present

## 2022-03-17 DIAGNOSIS — M47896 Other spondylosis, lumbar region: Secondary | ICD-10-CM | POA: Diagnosis not present

## 2022-03-17 DIAGNOSIS — Z6829 Body mass index (BMI) 29.0-29.9, adult: Secondary | ICD-10-CM | POA: Diagnosis not present

## 2022-04-22 DIAGNOSIS — E119 Type 2 diabetes mellitus without complications: Secondary | ICD-10-CM | POA: Diagnosis not present

## 2022-04-22 DIAGNOSIS — R5383 Other fatigue: Secondary | ICD-10-CM | POA: Diagnosis not present

## 2022-04-22 DIAGNOSIS — I959 Hypotension, unspecified: Secondary | ICD-10-CM | POA: Diagnosis not present

## 2022-05-02 DIAGNOSIS — R030 Elevated blood-pressure reading, without diagnosis of hypertension: Secondary | ICD-10-CM | POA: Diagnosis not present

## 2022-05-02 DIAGNOSIS — Z23 Encounter for immunization: Secondary | ICD-10-CM | POA: Diagnosis not present

## 2022-05-02 DIAGNOSIS — Z6829 Body mass index (BMI) 29.0-29.9, adult: Secondary | ICD-10-CM | POA: Diagnosis not present

## 2022-05-02 DIAGNOSIS — E1169 Type 2 diabetes mellitus with other specified complication: Secondary | ICD-10-CM | POA: Diagnosis not present

## 2022-05-02 DIAGNOSIS — Z77098 Contact with and (suspected) exposure to other hazardous, chiefly nonmedicinal, chemicals: Secondary | ICD-10-CM | POA: Diagnosis not present

## 2022-05-02 DIAGNOSIS — N401 Enlarged prostate with lower urinary tract symptoms: Secondary | ICD-10-CM | POA: Diagnosis not present

## 2022-05-02 DIAGNOSIS — E7849 Other hyperlipidemia: Secondary | ICD-10-CM | POA: Diagnosis not present

## 2022-05-02 DIAGNOSIS — I2510 Atherosclerotic heart disease of native coronary artery without angina pectoris: Secondary | ICD-10-CM | POA: Diagnosis not present

## 2022-05-02 DIAGNOSIS — M5136 Other intervertebral disc degeneration, lumbar region: Secondary | ICD-10-CM | POA: Diagnosis not present

## 2022-06-02 DIAGNOSIS — X32XXXD Exposure to sunlight, subsequent encounter: Secondary | ICD-10-CM | POA: Diagnosis not present

## 2022-06-02 DIAGNOSIS — Z1283 Encounter for screening for malignant neoplasm of skin: Secondary | ICD-10-CM | POA: Diagnosis not present

## 2022-06-02 DIAGNOSIS — L820 Inflamed seborrheic keratosis: Secondary | ICD-10-CM | POA: Diagnosis not present

## 2022-06-02 DIAGNOSIS — C44219 Basal cell carcinoma of skin of left ear and external auricular canal: Secondary | ICD-10-CM | POA: Diagnosis not present

## 2022-06-02 DIAGNOSIS — L821 Other seborrheic keratosis: Secondary | ICD-10-CM | POA: Diagnosis not present

## 2022-06-02 DIAGNOSIS — D225 Melanocytic nevi of trunk: Secondary | ICD-10-CM | POA: Diagnosis not present

## 2022-06-02 DIAGNOSIS — C4441 Basal cell carcinoma of skin of scalp and neck: Secondary | ICD-10-CM | POA: Diagnosis not present

## 2022-06-02 DIAGNOSIS — L570 Actinic keratosis: Secondary | ICD-10-CM | POA: Diagnosis not present

## 2022-06-16 DIAGNOSIS — R002 Palpitations: Secondary | ICD-10-CM | POA: Diagnosis not present

## 2022-06-19 DIAGNOSIS — I459 Conduction disorder, unspecified: Secondary | ICD-10-CM | POA: Diagnosis not present

## 2022-07-02 DIAGNOSIS — R002 Palpitations: Secondary | ICD-10-CM | POA: Diagnosis not present

## 2022-10-08 DIAGNOSIS — L03114 Cellulitis of left upper limb: Secondary | ICD-10-CM | POA: Diagnosis not present

## 2022-10-08 DIAGNOSIS — R030 Elevated blood-pressure reading, without diagnosis of hypertension: Secondary | ICD-10-CM | POA: Diagnosis not present

## 2022-10-08 DIAGNOSIS — M79642 Pain in left hand: Secondary | ICD-10-CM | POA: Diagnosis not present

## 2022-10-08 DIAGNOSIS — Z6829 Body mass index (BMI) 29.0-29.9, adult: Secondary | ICD-10-CM | POA: Diagnosis not present

## 2022-10-21 IMAGING — DX DG CHEST 2V
2 series · 2 of 2 positions shown · non-contrast
Comparison: None.

CLINICAL DATA: Cough

EXAM:
CHEST - 2 VIEW

[chest pa]
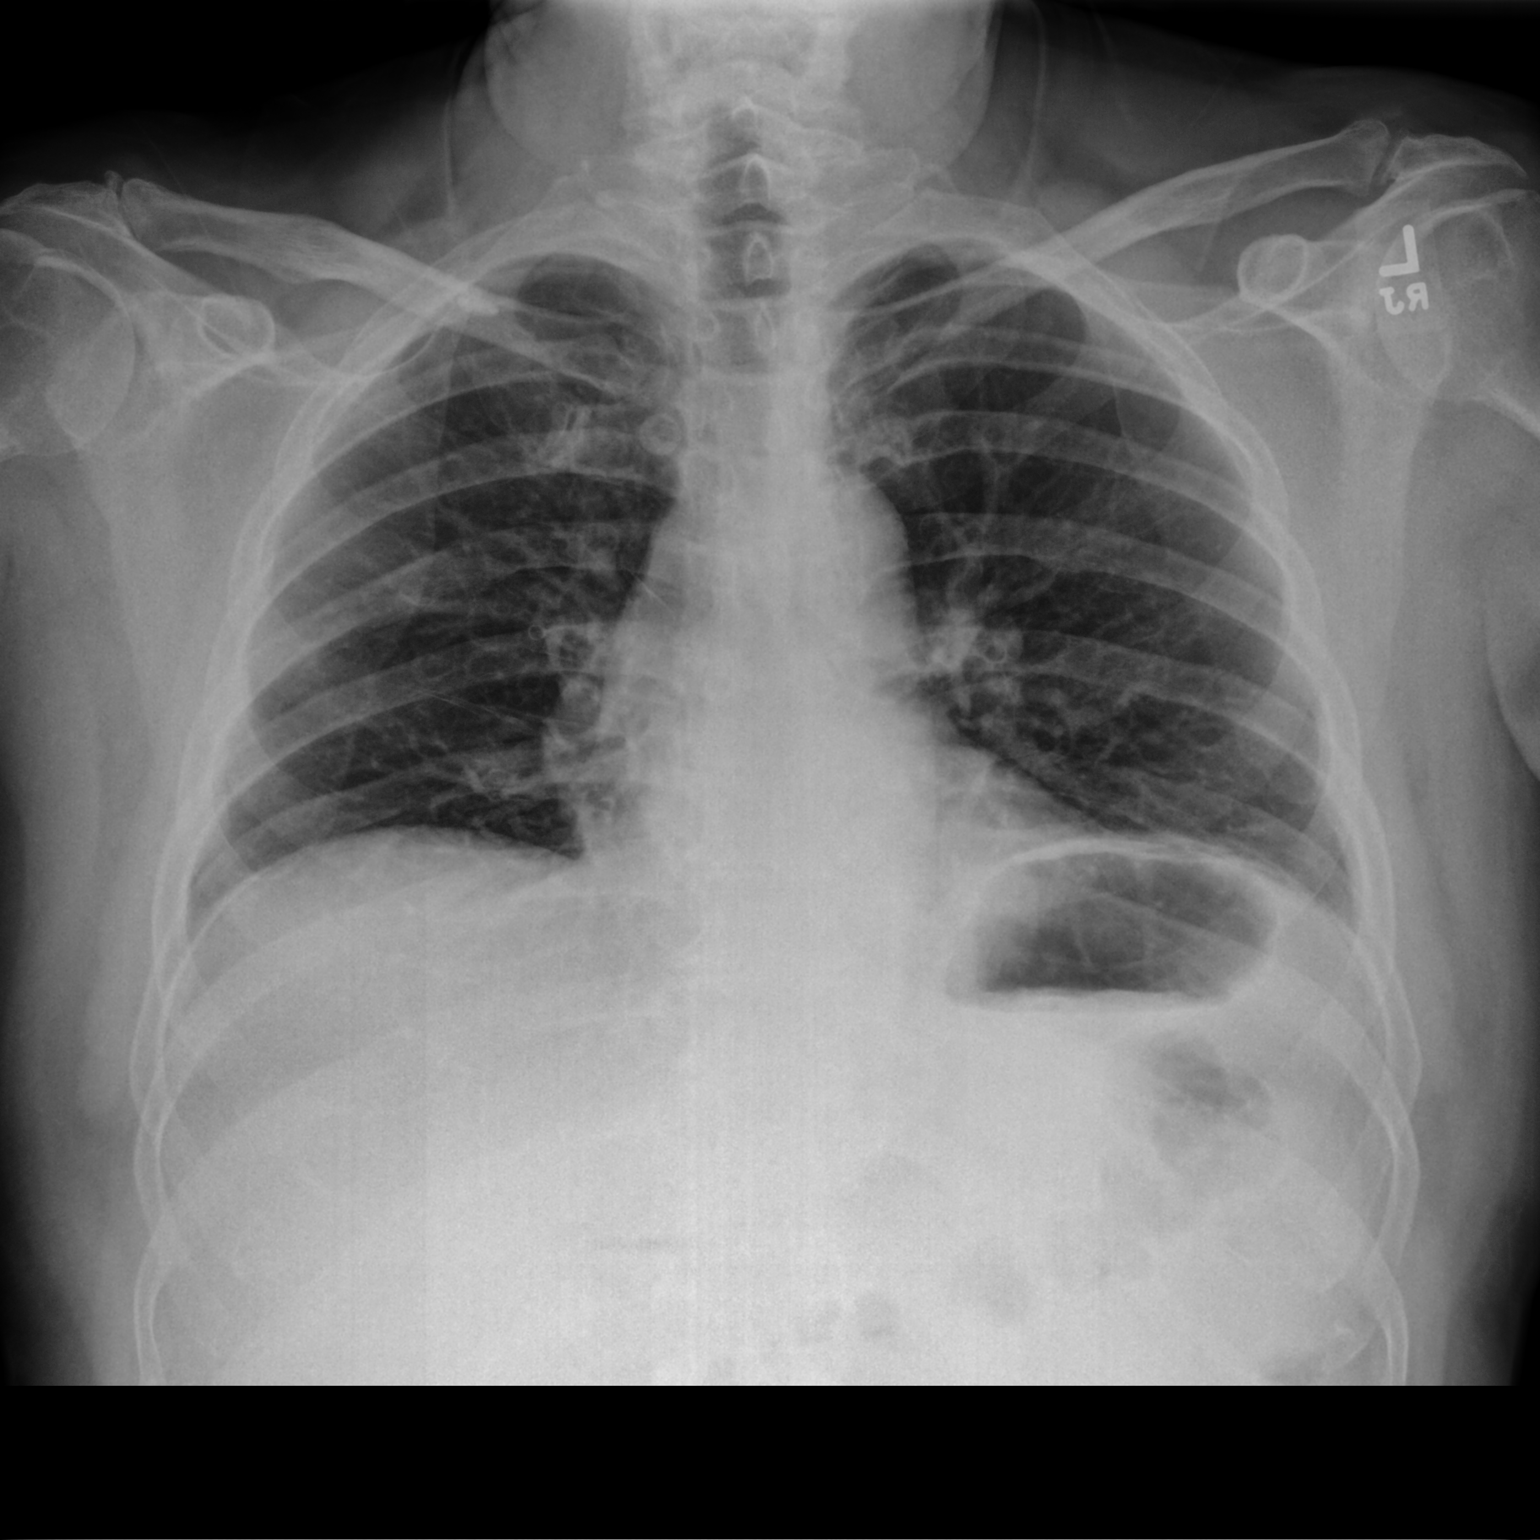

[chest lat]
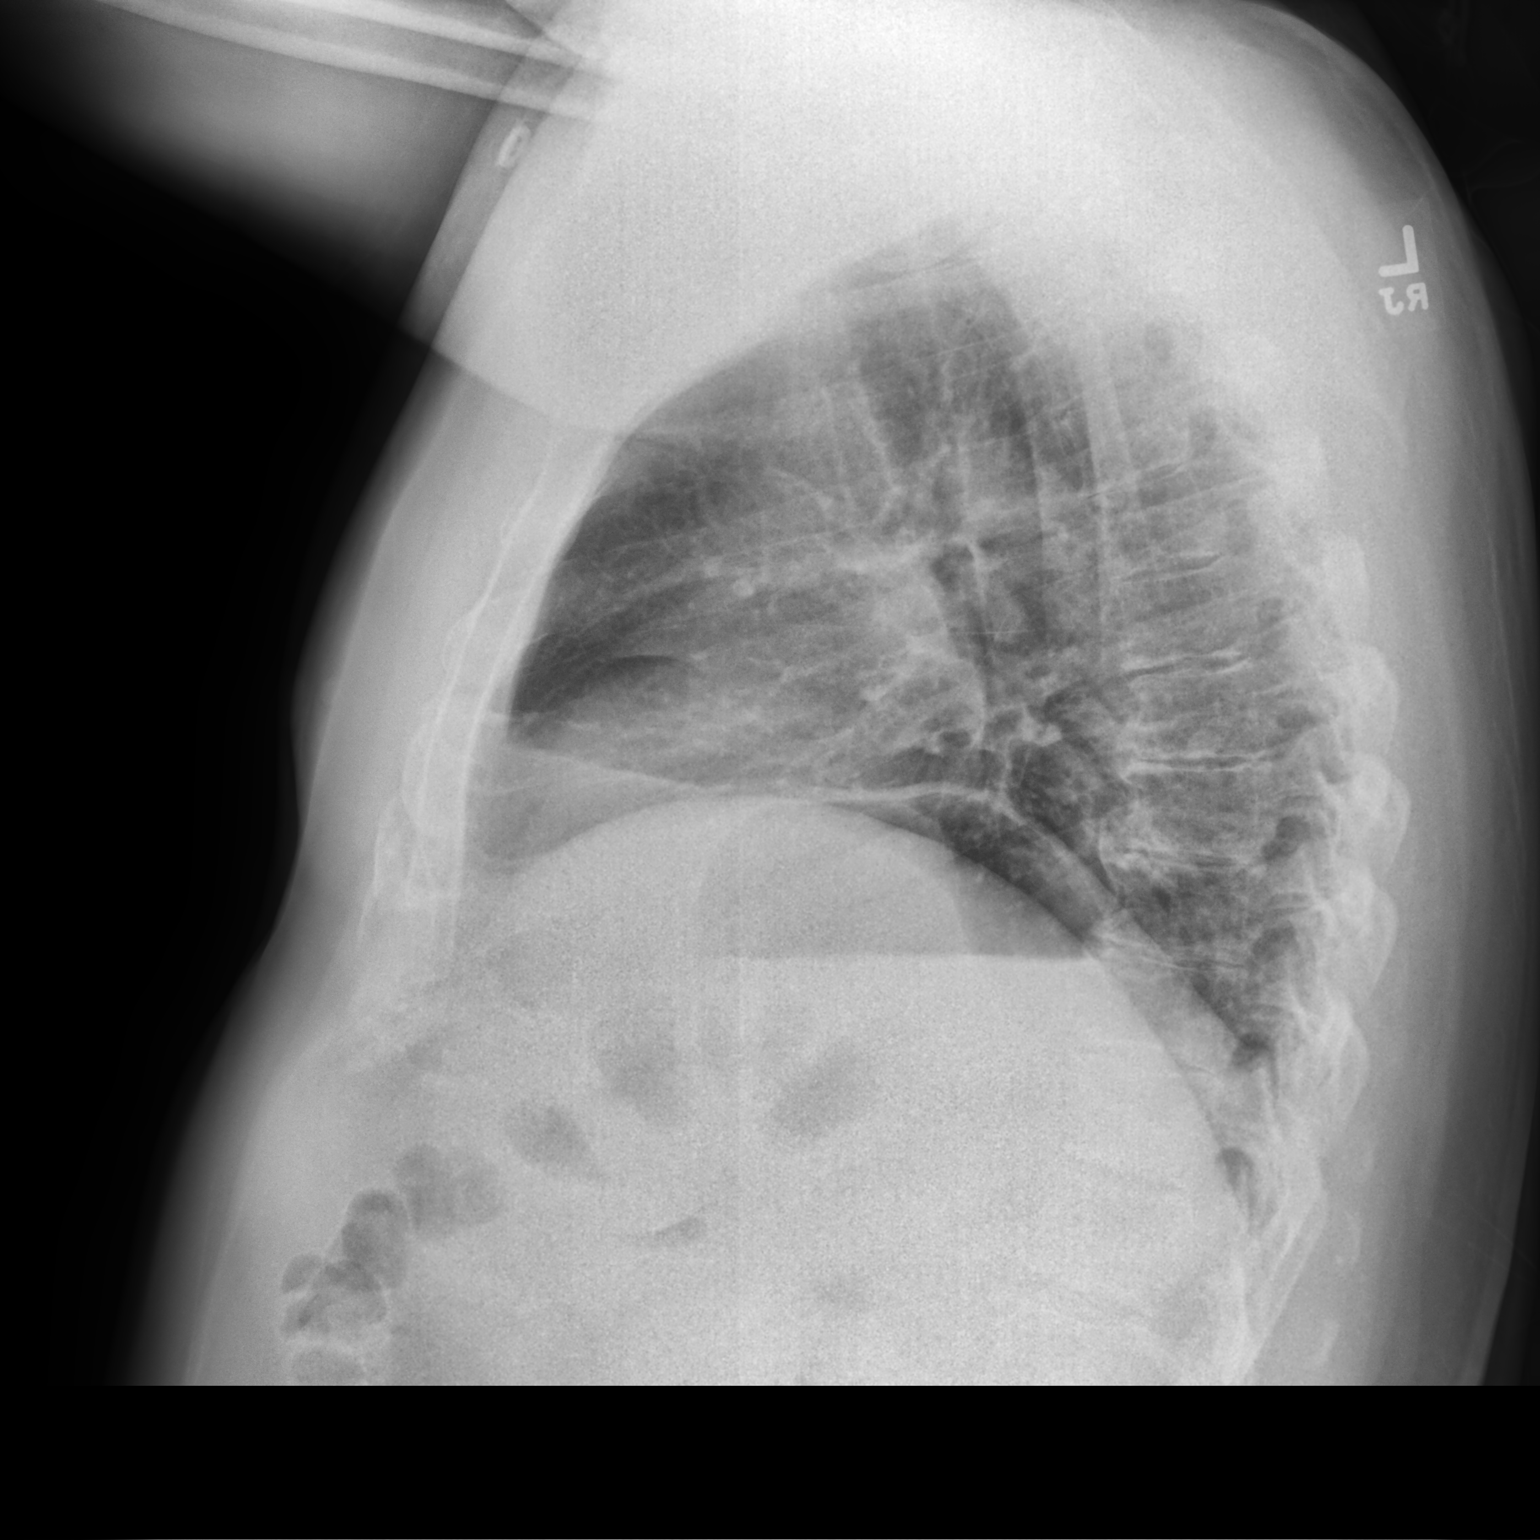

[2 of 2 positions shown; findings below may reference images not displayed]

FINDINGS: Heart and mediastinal contours are within normal limits. No focal
opacities or effusions. No acute bony abnormality. Low lung volumes.
IMPRESSION: No active cardiopulmonary disease.

## 2022-10-23 DIAGNOSIS — Z1329 Encounter for screening for other suspected endocrine disorder: Secondary | ICD-10-CM | POA: Diagnosis not present

## 2022-10-23 DIAGNOSIS — K760 Fatty (change of) liver, not elsewhere classified: Secondary | ICD-10-CM | POA: Diagnosis not present

## 2022-10-23 DIAGNOSIS — E1169 Type 2 diabetes mellitus with other specified complication: Secondary | ICD-10-CM | POA: Diagnosis not present

## 2022-10-23 DIAGNOSIS — E1122 Type 2 diabetes mellitus with diabetic chronic kidney disease: Secondary | ICD-10-CM | POA: Diagnosis not present

## 2022-10-23 DIAGNOSIS — E7849 Other hyperlipidemia: Secondary | ICD-10-CM | POA: Diagnosis not present

## 2022-10-23 DIAGNOSIS — J449 Chronic obstructive pulmonary disease, unspecified: Secondary | ICD-10-CM | POA: Diagnosis not present

## 2022-10-23 DIAGNOSIS — E7801 Familial hypercholesterolemia: Secondary | ICD-10-CM | POA: Diagnosis not present

## 2022-10-30 DIAGNOSIS — Z77098 Contact with and (suspected) exposure to other hazardous, chiefly nonmedicinal, chemicals: Secondary | ICD-10-CM | POA: Diagnosis not present

## 2022-10-30 DIAGNOSIS — Z23 Encounter for immunization: Secondary | ICD-10-CM | POA: Diagnosis not present

## 2022-10-30 DIAGNOSIS — E7849 Other hyperlipidemia: Secondary | ICD-10-CM | POA: Diagnosis not present

## 2022-10-30 DIAGNOSIS — D126 Benign neoplasm of colon, unspecified: Secondary | ICD-10-CM | POA: Diagnosis not present

## 2022-10-30 DIAGNOSIS — N401 Enlarged prostate with lower urinary tract symptoms: Secondary | ICD-10-CM | POA: Diagnosis not present

## 2022-10-30 DIAGNOSIS — E1169 Type 2 diabetes mellitus with other specified complication: Secondary | ICD-10-CM | POA: Diagnosis not present

## 2022-10-30 DIAGNOSIS — I2510 Atherosclerotic heart disease of native coronary artery without angina pectoris: Secondary | ICD-10-CM | POA: Diagnosis not present

## 2022-10-30 DIAGNOSIS — Z800 Family history of malignant neoplasm of digestive organs: Secondary | ICD-10-CM | POA: Diagnosis not present

## 2022-10-30 DIAGNOSIS — N5319 Other ejaculatory dysfunction: Secondary | ICD-10-CM | POA: Diagnosis not present

## 2022-10-30 DIAGNOSIS — M5136 Other intervertebral disc degeneration, lumbar region: Secondary | ICD-10-CM | POA: Diagnosis not present

## 2022-10-30 DIAGNOSIS — R030 Elevated blood-pressure reading, without diagnosis of hypertension: Secondary | ICD-10-CM | POA: Diagnosis not present

## 2022-11-05 DIAGNOSIS — N5319 Other ejaculatory dysfunction: Secondary | ICD-10-CM | POA: Diagnosis not present

## 2022-12-08 DIAGNOSIS — R030 Elevated blood-pressure reading, without diagnosis of hypertension: Secondary | ICD-10-CM | POA: Diagnosis not present

## 2022-12-08 DIAGNOSIS — T148XXA Other injury of unspecified body region, initial encounter: Secondary | ICD-10-CM | POA: Diagnosis not present

## 2022-12-08 DIAGNOSIS — Z6829 Body mass index (BMI) 29.0-29.9, adult: Secondary | ICD-10-CM | POA: Diagnosis not present

## 2023-01-23 DIAGNOSIS — Z0001 Encounter for general adult medical examination with abnormal findings: Secondary | ICD-10-CM | POA: Diagnosis not present

## 2023-01-23 DIAGNOSIS — F17210 Nicotine dependence, cigarettes, uncomplicated: Secondary | ICD-10-CM | POA: Diagnosis not present

## 2023-01-23 DIAGNOSIS — E1122 Type 2 diabetes mellitus with diabetic chronic kidney disease: Secondary | ICD-10-CM | POA: Diagnosis not present

## 2023-01-23 DIAGNOSIS — E7849 Other hyperlipidemia: Secondary | ICD-10-CM | POA: Diagnosis not present

## 2023-01-23 DIAGNOSIS — R030 Elevated blood-pressure reading, without diagnosis of hypertension: Secondary | ICD-10-CM | POA: Diagnosis not present

## 2023-01-23 DIAGNOSIS — J449 Chronic obstructive pulmonary disease, unspecified: Secondary | ICD-10-CM | POA: Diagnosis not present

## 2023-01-29 DIAGNOSIS — Z6829 Body mass index (BMI) 29.0-29.9, adult: Secondary | ICD-10-CM | POA: Diagnosis not present

## 2023-01-29 DIAGNOSIS — R030 Elevated blood-pressure reading, without diagnosis of hypertension: Secondary | ICD-10-CM | POA: Diagnosis not present

## 2023-01-29 DIAGNOSIS — L84 Corns and callosities: Secondary | ICD-10-CM | POA: Diagnosis not present

## 2023-01-29 DIAGNOSIS — S90851A Superficial foreign body, right foot, initial encounter: Secondary | ICD-10-CM | POA: Diagnosis not present

## 2023-02-09 DIAGNOSIS — R519 Headache, unspecified: Secondary | ICD-10-CM | POA: Diagnosis not present

## 2023-02-09 DIAGNOSIS — Z6829 Body mass index (BMI) 29.0-29.9, adult: Secondary | ICD-10-CM | POA: Diagnosis not present

## 2023-02-09 DIAGNOSIS — R030 Elevated blood-pressure reading, without diagnosis of hypertension: Secondary | ICD-10-CM | POA: Diagnosis not present

## 2023-02-09 DIAGNOSIS — S90851A Superficial foreign body, right foot, initial encounter: Secondary | ICD-10-CM | POA: Diagnosis not present

## 2023-02-09 DIAGNOSIS — L84 Corns and callosities: Secondary | ICD-10-CM | POA: Diagnosis not present

## 2023-02-09 DIAGNOSIS — G43909 Migraine, unspecified, not intractable, without status migrainosus: Secondary | ICD-10-CM | POA: Diagnosis not present

## 2023-02-10 DIAGNOSIS — R5383 Other fatigue: Secondary | ICD-10-CM | POA: Diagnosis not present

## 2023-02-10 DIAGNOSIS — E1122 Type 2 diabetes mellitus with diabetic chronic kidney disease: Secondary | ICD-10-CM | POA: Diagnosis not present

## 2023-02-10 DIAGNOSIS — R0902 Hypoxemia: Secondary | ICD-10-CM | POA: Diagnosis not present

## 2023-03-05 DIAGNOSIS — Z6829 Body mass index (BMI) 29.0-29.9, adult: Secondary | ICD-10-CM | POA: Diagnosis not present

## 2023-03-05 DIAGNOSIS — R030 Elevated blood-pressure reading, without diagnosis of hypertension: Secondary | ICD-10-CM | POA: Diagnosis not present

## 2023-03-05 DIAGNOSIS — L858 Other specified epidermal thickening: Secondary | ICD-10-CM | POA: Diagnosis not present

## 2023-03-05 DIAGNOSIS — N401 Enlarged prostate with lower urinary tract symptoms: Secondary | ICD-10-CM | POA: Diagnosis not present

## 2023-03-05 DIAGNOSIS — N5319 Other ejaculatory dysfunction: Secondary | ICD-10-CM | POA: Diagnosis not present

## 2023-03-10 DIAGNOSIS — C44622 Squamous cell carcinoma of skin of right upper limb, including shoulder: Secondary | ICD-10-CM | POA: Diagnosis not present

## 2023-03-13 DIAGNOSIS — L858 Other specified epidermal thickening: Secondary | ICD-10-CM | POA: Diagnosis not present

## 2023-03-13 DIAGNOSIS — Z6829 Body mass index (BMI) 29.0-29.9, adult: Secondary | ICD-10-CM | POA: Diagnosis not present

## 2023-03-13 DIAGNOSIS — R030 Elevated blood-pressure reading, without diagnosis of hypertension: Secondary | ICD-10-CM | POA: Diagnosis not present

## 2023-03-13 DIAGNOSIS — N5319 Other ejaculatory dysfunction: Secondary | ICD-10-CM | POA: Diagnosis not present

## 2023-03-13 DIAGNOSIS — N401 Enlarged prostate with lower urinary tract symptoms: Secondary | ICD-10-CM | POA: Diagnosis not present

## 2023-04-20 DIAGNOSIS — Z08 Encounter for follow-up examination after completed treatment for malignant neoplasm: Secondary | ICD-10-CM | POA: Diagnosis not present

## 2023-04-20 DIAGNOSIS — Z85828 Personal history of other malignant neoplasm of skin: Secondary | ICD-10-CM | POA: Diagnosis not present

## 2023-04-20 DIAGNOSIS — L820 Inflamed seborrheic keratosis: Secondary | ICD-10-CM | POA: Diagnosis not present

## 2023-06-15 DIAGNOSIS — S161XXA Strain of muscle, fascia and tendon at neck level, initial encounter: Secondary | ICD-10-CM | POA: Diagnosis not present

## 2023-06-15 DIAGNOSIS — Z6829 Body mass index (BMI) 29.0-29.9, adult: Secondary | ICD-10-CM | POA: Diagnosis not present

## 2023-06-15 DIAGNOSIS — R030 Elevated blood-pressure reading, without diagnosis of hypertension: Secondary | ICD-10-CM | POA: Diagnosis not present

## 2023-08-18 DIAGNOSIS — H04123 Dry eye syndrome of bilateral lacrimal glands: Secondary | ICD-10-CM | POA: Diagnosis not present

## 2023-09-11 DIAGNOSIS — J019 Acute sinusitis, unspecified: Secondary | ICD-10-CM | POA: Diagnosis not present

## 2023-09-11 DIAGNOSIS — E1122 Type 2 diabetes mellitus with diabetic chronic kidney disease: Secondary | ICD-10-CM | POA: Diagnosis not present

## 2023-09-11 DIAGNOSIS — Z6829 Body mass index (BMI) 29.0-29.9, adult: Secondary | ICD-10-CM | POA: Diagnosis not present

## 2023-09-30 DIAGNOSIS — B880 Other acariasis: Secondary | ICD-10-CM | POA: Diagnosis not present

## 2023-09-30 DIAGNOSIS — H018 Other specified inflammations of eyelid: Secondary | ICD-10-CM | POA: Diagnosis not present

## 2023-09-30 DIAGNOSIS — H04123 Dry eye syndrome of bilateral lacrimal glands: Secondary | ICD-10-CM | POA: Diagnosis not present

## 2023-10-15 DIAGNOSIS — H6122 Impacted cerumen, left ear: Secondary | ICD-10-CM | POA: Diagnosis not present

## 2023-10-15 DIAGNOSIS — H9203 Otalgia, bilateral: Secondary | ICD-10-CM | POA: Diagnosis not present

## 2023-10-15 DIAGNOSIS — H6121 Impacted cerumen, right ear: Secondary | ICD-10-CM | POA: Diagnosis not present

## 2023-10-15 DIAGNOSIS — Z6829 Body mass index (BMI) 29.0-29.9, adult: Secondary | ICD-10-CM | POA: Diagnosis not present

## 2023-10-15 DIAGNOSIS — H6120 Impacted cerumen, unspecified ear: Secondary | ICD-10-CM | POA: Diagnosis not present

## 2023-10-15 DIAGNOSIS — H9201 Otalgia, right ear: Secondary | ICD-10-CM | POA: Diagnosis not present

## 2023-10-15 DIAGNOSIS — H9202 Otalgia, left ear: Secondary | ICD-10-CM | POA: Diagnosis not present

## 2023-11-02 DIAGNOSIS — Z08 Encounter for follow-up examination after completed treatment for malignant neoplasm: Secondary | ICD-10-CM | POA: Diagnosis not present

## 2023-11-02 DIAGNOSIS — L570 Actinic keratosis: Secondary | ICD-10-CM | POA: Diagnosis not present

## 2023-11-02 DIAGNOSIS — X32XXXD Exposure to sunlight, subsequent encounter: Secondary | ICD-10-CM | POA: Diagnosis not present

## 2023-11-02 DIAGNOSIS — Z85828 Personal history of other malignant neoplasm of skin: Secondary | ICD-10-CM | POA: Diagnosis not present

## 2023-11-02 DIAGNOSIS — B078 Other viral warts: Secondary | ICD-10-CM | POA: Diagnosis not present

## 2023-11-05 DIAGNOSIS — R220 Localized swelling, mass and lump, head: Secondary | ICD-10-CM | POA: Diagnosis not present

## 2023-11-05 DIAGNOSIS — L03211 Cellulitis of face: Secondary | ICD-10-CM | POA: Diagnosis not present

## 2023-11-05 DIAGNOSIS — Z6829 Body mass index (BMI) 29.0-29.9, adult: Secondary | ICD-10-CM | POA: Diagnosis not present

## 2023-12-02 DIAGNOSIS — H0288A Meibomian gland dysfunction right eye, upper and lower eyelids: Secondary | ICD-10-CM | POA: Diagnosis not present

## 2023-12-02 DIAGNOSIS — H018 Other specified inflammations of eyelid: Secondary | ICD-10-CM | POA: Diagnosis not present

## 2023-12-02 DIAGNOSIS — B880 Other acariasis: Secondary | ICD-10-CM | POA: Diagnosis not present

## 2023-12-02 DIAGNOSIS — H04123 Dry eye syndrome of bilateral lacrimal glands: Secondary | ICD-10-CM | POA: Diagnosis not present

## 2023-12-02 DIAGNOSIS — H0288B Meibomian gland dysfunction left eye, upper and lower eyelids: Secondary | ICD-10-CM | POA: Diagnosis not present

## 2024-03-27 ENCOUNTER — Emergency Department (HOSPITAL_COMMUNITY)
Admission: EM | Admit: 2024-03-27 | Discharge: 2024-03-27 | Disposition: A | Discharge status: Home / Self Care | Attending: Emergency Medicine | Admitting: Emergency Medicine

## 2024-03-27 ENCOUNTER — Other Ambulatory Visit: Payer: Self-pay

## 2024-03-27 ENCOUNTER — Emergency Department (HOSPITAL_COMMUNITY)

## 2024-03-27 ENCOUNTER — Encounter (HOSPITAL_COMMUNITY): Payer: Self-pay | Admitting: *Deleted

## 2024-03-27 DIAGNOSIS — K760 Fatty (change of) liver, not elsewhere classified: Secondary | ICD-10-CM | POA: Insufficient documentation

## 2024-03-27 DIAGNOSIS — M47812 Spondylosis without myelopathy or radiculopathy, cervical region: Secondary | ICD-10-CM | POA: Insufficient documentation

## 2024-03-27 DIAGNOSIS — E119 Type 2 diabetes mellitus without complications: Secondary | ICD-10-CM | POA: Diagnosis not present

## 2024-03-27 DIAGNOSIS — I2510 Atherosclerotic heart disease of native coronary artery without angina pectoris: Secondary | ICD-10-CM | POA: Insufficient documentation

## 2024-03-27 DIAGNOSIS — S2232XA Fracture of one rib, left side, initial encounter for closed fracture: Secondary | ICD-10-CM | POA: Diagnosis not present

## 2024-03-27 DIAGNOSIS — I10 Essential (primary) hypertension: Secondary | ICD-10-CM | POA: Diagnosis not present

## 2024-03-27 DIAGNOSIS — Z7984 Long term (current) use of oral hypoglycemic drugs: Secondary | ICD-10-CM | POA: Insufficient documentation

## 2024-03-27 DIAGNOSIS — M19032 Primary osteoarthritis, left wrist: Secondary | ICD-10-CM | POA: Diagnosis not present

## 2024-03-27 DIAGNOSIS — S0990XA Unspecified injury of head, initial encounter: Secondary | ICD-10-CM | POA: Diagnosis not present

## 2024-03-27 DIAGNOSIS — W109XXA Fall (on) (from) unspecified stairs and steps, initial encounter: Secondary | ICD-10-CM | POA: Diagnosis not present

## 2024-03-27 DIAGNOSIS — Z043 Encounter for examination and observation following other accident: Secondary | ICD-10-CM | POA: Diagnosis not present

## 2024-03-27 DIAGNOSIS — M542 Cervicalgia: Secondary | ICD-10-CM | POA: Diagnosis not present

## 2024-03-27 DIAGNOSIS — M25532 Pain in left wrist: Secondary | ICD-10-CM | POA: Insufficient documentation

## 2024-03-27 DIAGNOSIS — M4802 Spinal stenosis, cervical region: Secondary | ICD-10-CM | POA: Diagnosis not present

## 2024-03-27 DIAGNOSIS — R109 Unspecified abdominal pain: Secondary | ICD-10-CM | POA: Diagnosis not present

## 2024-03-27 DIAGNOSIS — T07XXXA Unspecified multiple injuries, initial encounter: Secondary | ICD-10-CM | POA: Diagnosis not present

## 2024-03-27 DIAGNOSIS — M19042 Primary osteoarthritis, left hand: Secondary | ICD-10-CM | POA: Diagnosis not present

## 2024-03-27 DIAGNOSIS — Z79899 Other long term (current) drug therapy: Secondary | ICD-10-CM | POA: Insufficient documentation

## 2024-03-27 DIAGNOSIS — S199XXA Unspecified injury of neck, initial encounter: Secondary | ICD-10-CM | POA: Diagnosis not present

## 2024-03-27 DIAGNOSIS — R519 Headache, unspecified: Secondary | ICD-10-CM | POA: Diagnosis not present

## 2024-03-27 DIAGNOSIS — S299XXA Unspecified injury of thorax, initial encounter: Secondary | ICD-10-CM | POA: Diagnosis present

## 2024-03-27 DIAGNOSIS — W19XXXA Unspecified fall, initial encounter: Secondary | ICD-10-CM | POA: Insufficient documentation

## 2024-03-27 LAB — CBC WITH DIFFERENTIAL/PLATELET
Abs Immature Granulocytes: 0.03 K/uL (ref 0.00–0.07)
Basophils Absolute: 0 K/uL (ref 0.0–0.1)
Basophils Relative: 1 %
Eosinophils Absolute: 0.2 K/uL (ref 0.0–0.5)
Eosinophils Relative: 3 %
HCT: 40.1 % (ref 39.0–52.0)
Hemoglobin: 13.2 g/dL (ref 13.0–17.0)
Immature Granulocytes: 0 %
Lymphocytes Relative: 25 %
Lymphs Abs: 1.9 K/uL (ref 0.7–4.0)
MCH: 29.7 pg (ref 26.0–34.0)
MCHC: 32.9 g/dL (ref 30.0–36.0)
MCV: 90.1 fL (ref 80.0–100.0)
Monocytes Absolute: 0.7 K/uL (ref 0.1–1.0)
Monocytes Relative: 9 %
Neutro Abs: 4.8 K/uL (ref 1.7–7.7)
Neutrophils Relative %: 62 %
Platelets: 206 K/uL (ref 150–400)
RBC: 4.45 MIL/uL (ref 4.22–5.81)
RDW: 12.3 % (ref 11.5–15.5)
WBC: 7.7 K/uL (ref 4.0–10.5)
nRBC: 0 % (ref 0.0–0.2)

## 2024-03-27 LAB — COMPREHENSIVE METABOLIC PANEL WITH GFR
ALT: 36 U/L (ref 0–44)
AST: 23 U/L (ref 15–41)
Albumin: 3.8 g/dL (ref 3.5–5.0)
Alkaline Phosphatase: 57 U/L (ref 38–126)
Anion gap: 10 (ref 5–15)
BUN: 16 mg/dL (ref 8–23)
CO2: 24 mmol/L (ref 22–32)
Calcium: 8.7 mg/dL — ABNORMAL LOW (ref 8.9–10.3)
Chloride: 106 mmol/L (ref 98–111)
Creatinine, Ser: 0.78 mg/dL (ref 0.61–1.24)
GFR, Estimated: >60 mL/min (ref >60)
Glucose, Bld: 175 mg/dL — ABNORMAL HIGH (ref 70–99)
Potassium: 3.9 mmol/L (ref 3.5–5.1)
Sodium: 140 mmol/L (ref 135–145)
Total Bilirubin: 0.6 mg/dL (ref 0.0–1.2)
Total Protein: 6.8 g/dL (ref 6.5–8.1)

## 2024-03-27 LAB — URINALYSIS, ROUTINE W REFLEX MICROSCOPIC
Bacteria, UA: NONE SEEN
Bilirubin Urine: NEGATIVE
Glucose, UA: >=500 mg/dL — AB
Hgb urine dipstick: NEGATIVE
Ketones, ur: NEGATIVE mg/dL
Leukocytes,Ua: NEGATIVE
Nitrite: NEGATIVE
Protein, ur: NEGATIVE mg/dL
Specific Gravity, Urine: 1.026 (ref 1.005–1.030)
pH: 5 (ref 5.0–8.0)

## 2024-03-27 LAB — LIPASE, BLOOD: Lipase: 28 U/L (ref 11–51)

## 2024-03-27 MED ORDER — ACETAMINOPHEN 325 MG PO TABS
650.0000 mg | ORAL_TABLET | Freq: Once | ORAL | Status: AC
Start: 2024-03-27 — End: 2024-03-27
  Administered 2024-03-27: 650 mg via ORAL
  Filled 2024-03-27: qty 2

## 2024-03-27 MED ORDER — HYDROCODONE-ACETAMINOPHEN 5-325 MG PO TABS: ORAL_TABLET | ORAL | 0 refills | Status: AC

## 2024-03-27 NOTE — ED Notes (Signed)
 See triage notes. Nad. A/o rom to left wrist wnl, no deformity noted. Radial pulses present

## 2024-03-27 NOTE — ED Triage Notes (Signed)
 Pt fell this morning after missing a step face forward. Denies LOC. Denies taking blood thinners. Pt c/o left side pain, worse certain movements and to touch. Denies hitting his head with fall.

## 2024-03-27 NOTE — Discharge Instructions (Signed)
 Your workup this evening shows that you have a rib fracture on the left side.  Use a pillow or folded towel as we discussed to take several deep breaths throughout the day or when you feel like you are going to sneeze.  I have prescribed pain medication for you, this can cause drowsiness do not operate machinery or drive while taking the medication.  It can also cause constipation so I recommend that you take over-the-counter stool softener such as Colace while taking the pain medication.  Please call your primary care provider tomorrow to arrange follow-up appointment for recheck.  Return to the emergency department if you develop any new or worsening symptoms.

## 2024-03-28 NOTE — ED Provider Notes (Signed)
 [place] EMERGENCY DEPARTMENT AT [facility] Provider Note   CSN: [id number] Arrival date & time: 03/27/24  1509     Patient presents with: Jerry Dodson Hardie is a 78 y.o. male.    Fall Associated symptoms include chest pain (Left lateral chest wall pain). Pertinent negatives include no abdominal pain, no headaches and no shortness of breath.       Jerry Dodson is a 78 y.o. male past medical history of hypertension, type 2 diabetes, who presents to the Emergency Department for evaluation of left lateral chest wall pain and wrist pain after a mechanical fall.  He states that he was climbing up some steps when he missed a step causing him to fall landing on his left side.  He denies loss of consciousness and does not think he struck his head.  He has pain to his left lower chest wall that is associated with deep breathing and movement.  He also has some discomfort of his left wrist.  He denies any nausea, vomiting, dizziness, visual changes, and he does not take blood thinners.  Prior to Admission medications   Medication Sig Start Date End Date Taking? Authorizing Provider  HYDROcodone-acetaminophen  (NORCO/VICODIN) 5-325 MG tablet Take one tab po q 4 hrs prn pain 03/27/24  Yes Jayro Mcmath, PA-C  atorvastatin  (LIPITOR) 10 MG tablet Take 10 mg by mouth every morning.    [provider]  dutasteride (AVODART) 0.5 MG capsule Take 0.5 mg by mouth daily. 02/22/21   [provider]  ibuprofen (ADVIL) 200 MG tablet Take 400 mg by mouth every 6 (six) hours as needed for moderate pain.    [provider]  metFORMIN  (GLUCOPHAGE ) 500 MG tablet Take 500 mg by mouth 2 (two) times daily with a meal.    [provider]  Polyethyl Glycol-Propyl Glycol (SYSTANE OP) Place 1 drop into both eyes in the morning, at noon, in the evening, and at bedtime.    [provider]  tamsulosin (FLOMAX) 0.4 MG CAPS capsule Take 0.4 mg by mouth daily. 01/29/21    [provider]  valsartan  (DIOVAN ) 80 MG tablet TAKE 1 TABLET BY MOUTH DAILY 05/29/21   Sood, Vineet, MD    Allergies: Lisinopril     Review of Systems  Constitutional:  Negative for fever.  Eyes:  Negative for visual disturbance.  Respiratory:  Negative for shortness of breath.   Cardiovascular:  Positive for chest pain (Left lateral chest wall pain).  Gastrointestinal:  Negative for abdominal pain, nausea and vomiting.  Musculoskeletal:  Positive for arthralgias (Left wrist pain).  Neurological:  Negative for dizziness, syncope and headaches.    Updated Vital Signs BP 135/81   Pulse (!) 59   Temp 97.9 F (36.6 C)   Resp 20   Ht 6' (1.829 m)   Wt 99.8 kg   SpO2 98%   BMI 29.84 kg/m   Physical Exam Vitals and nursing note reviewed.  Constitutional:      General: He is not in acute distress.    Appearance: Normal appearance. He is not toxic-appearing.  Eyes:     Extraocular Movements: Extraocular movements intact.     Conjunctiva/sclera: Conjunctivae normal.     Pupils: Pupils are equal, round, and reactive to light.  Cardiovascular:     Rate and Rhythm: Normal rate and regular rhythm.     Pulses: Normal pulses.  Pulmonary:     Effort: Pulmonary effort is normal.  Chest:  Chest wall: Tenderness (Tenderness to the anterior lateral left lower chest wall.  No soft tissue crepitus) present.  Abdominal:     Palpations: Abdomen is soft.     Tenderness: There is no abdominal tenderness.  Musculoskeletal:        General: Tenderness (Tenderness with range of motion of the left wrist.  No bony deformities or edema.  Patient has full range of motion of the fingers of the left hand.  Left shoulder and elbow are nontender) present.     Cervical back: Full passive range of motion without pain and normal range of motion.  Skin:    General: Skin is warm.     Capillary Refill: Capillary refill takes less than 2 seconds.  Neurological:     General: No focal deficit  present.     Mental Status: He is alert.     Motor: No weakness.     (all labs ordered are listed, but only abnormal results are displayed) Labs Reviewed  COMPREHENSIVE METABOLIC PANEL WITH GFR - Abnormal; Notable for the following components:      Result Value   Glucose, Bld 175 (*)    Calcium  8.7 (*)    All other components within normal limits  URINALYSIS, ROUTINE W REFLEX MICROSCOPIC - Abnormal; Notable for the following components:   Glucose, UA >=500 (*)    All other components within normal limits  LIPASE, BLOOD  CBC WITH DIFFERENTIAL/PLATELET    EKG: None  Radiology: CT Head Wo Contrast Result Date: 03/27/2024 CLINICAL DATA:  Neck trauma (Age >= 65y) fall; Head trauma, minor (Age >= 65y) fall Pt fell this morning after missing a step face forward. Denies LOC. Denies taking blood thinners. Pt c/o left side pain, worse certain movements and to touch. Denies hitting his head with fall EXAM: CT HEAD WITHOUT CONTRAST CT CERVICAL SPINE WITHOUT CONTRAST TECHNIQUE: Multidetector CT imaging of the head and cervical spine was performed following the standard protocol without intravenous contrast. Multiplanar CT image reconstructions of the cervical spine were also generated. RADIATION DOSE REDUCTION: This exam was performed according to the departmental dose-optimization program which includes automated exposure control, adjustment of the mA and/or kV according to patient size and/or use of iterative reconstruction technique. COMPARISON:  None Available. FINDINGS: CT HEAD FINDINGS Brain: No evidence of large-territorial acute infarction. No parenchymal hemorrhage. No mass lesion. No extra-axial collection. No mass effect or midline shift. No hydrocephalus. Basilar cisterns are patent. Vascular: No hyperdense vessel. Skull: No acute fracture or focal lesion. Sinuses/Orbits: Paranasal sinuses and mastoid air cells are clear. Bilateral lens replacement. Otherwise the orbits are unremarkable.  Other: None. CT CERVICAL SPINE FINDINGS Alignment: Normal. Skull base and vertebrae: Multilevel moderate degenerative changes spine. Associated multilevel moderate severe osseous neural foraminal stenosis. No severe osseous central canal stenosis. No acute fracture. No aggressive appearing focal osseous lesion or focal pathologic process. Soft tissues and spinal canal: No prevertebral fluid or swelling. No visible canal hematoma. Upper chest: Unremarkable. Other: None. IMPRESSION: 1. No acute intracranial abnormality. 2. No acute displaced fracture or traumatic listhesis of the cervical spine. 3. Multilevel moderate degenerative changes spine. Associated multilevel moderate severe osseous neural foraminal stenosis. Electronically Signed   By: Morgane  Naveau M.D.   On: 03/27/2024 19:26   CT Cervical Spine Wo Contrast Result Date: 03/27/2024 CLINICAL DATA:  Neck trauma (Age >= 65y) fall; Head trauma, minor (Age >= 65y) fall Pt fell this morning after missing a step face forward. Denies LOC. Denies taking blood thinners.  Pt c/o left side pain, worse certain movements and to touch. Denies hitting his head with fall EXAM: CT HEAD WITHOUT CONTRAST CT CERVICAL SPINE WITHOUT CONTRAST TECHNIQUE: Multidetector CT imaging of the head and cervical spine was performed following the standard protocol without intravenous contrast. Multiplanar CT image reconstructions of the cervical spine were also generated. RADIATION DOSE REDUCTION: This exam was performed according to the departmental dose-optimization program which includes automated exposure control, adjustment of the mA and/or kV according to patient size and/or use of iterative reconstruction technique. COMPARISON:  None Available. FINDINGS: CT HEAD FINDINGS Brain: No evidence of large-territorial acute infarction. No parenchymal hemorrhage. No mass lesion. No extra-axial collection. No mass effect or midline shift. No hydrocephalus. Basilar cisterns are patent.  Vascular: No hyperdense vessel. Skull: No acute fracture or focal lesion. Sinuses/Orbits: Paranasal sinuses and mastoid air cells are clear. Bilateral lens replacement. Otherwise the orbits are unremarkable. Other: None. CT CERVICAL SPINE FINDINGS Alignment: Normal. Skull base and vertebrae: Multilevel moderate degenerative changes spine. Associated multilevel moderate severe osseous neural foraminal stenosis. No severe osseous central canal stenosis. No acute fracture. No aggressive appearing focal osseous lesion or focal pathologic process. Soft tissues and spinal canal: No prevertebral fluid or swelling. No visible canal hematoma. Upper chest: Unremarkable. Other: None. IMPRESSION: 1. No acute intracranial abnormality. 2. No acute displaced fracture or traumatic listhesis of the cervical spine. 3. Multilevel moderate degenerative changes spine. Associated multilevel moderate severe osseous neural foraminal stenosis. Electronically Signed   By: Morgane  Naveau M.D.   On: 03/27/2024 19:26   DG Wrist Complete Left Result Date: 03/27/2024 CLINICAL DATA:  fall EXAM: LEFT WRIST - COMPLETE 3+ VIEW COMPARISON:  None Available. FINDINGS: There is no evidence of fracture or dislocation. Mild-to-moderate degenerative changes of the first digit metacarpophalangeal joint. Mild Ulnocarpal and radiocarpal degenerative changes. Anterior wrist subcutaneus soft tissue edema. IMPRESSION: No acute displaced fracture or dislocation. Electronically Signed   By: Morgane  Naveau M.D.   On: 03/27/2024 19:18   CT CHEST ABDOMEN PELVIS WO CONTRAST Result Date: 03/27/2024 CLINICAL DATA:  Polytrauma, blunt fall.  Left side pain. EXAM: CT CHEST, ABDOMEN AND PELVIS WITHOUT CONTRAST TECHNIQUE: Multidetector CT imaging of the chest, abdomen and pelvis was performed following the standard protocol without IV contrast. RADIATION DOSE REDUCTION: This exam was performed according to the departmental dose-optimization program which includes  automated exposure control, adjustment of the mA and/or kV according to patient size and/or use of iterative reconstruction technique. COMPARISON:  None Available. FINDINGS: CT CHEST FINDINGS Cardiovascular: Heart is normal size. Aorta is normal caliber. Calcifications in the left anterior descending coronary artery. Mediastinum/Nodes: No mediastinal, hilar, or axillary adenopathy. Trachea and esophagus are unremarkable. Thyroid  unremarkable. Lungs/Pleura: Linear atelectasis or scarring in the lower lobes posteromedially. No effusions or pneumothorax. Musculoskeletal: Lucency noted through the posterior left 11th rib could reflect a subtle nondisplaced rib fracture. CT ABDOMEN PELVIS FINDINGS Hepatobiliary: No hepatic injury or perihepatic hematoma. Gallbladder unremarkable. Fatty liver. Pancreas: No focal abnormality or ductal dilatation. Spleen: No splenic injury or perisplenic hematoma. Adrenals/Urinary Tract: No adrenal hemorrhage or renal injury identified. Bladder is unremarkable. Stomach/Bowel: Stomach, large and small bowel grossly unremarkable. Vascular/Lymphatic: No evidence of aneurysm or adenopathy. Reproductive: No visible focal abnormality. Other: No free fluid or free air. Musculoskeletal: No acute bony abnormality IMPRESSION: Questionable nondisplaced posterior left rib fracture. No pneumothorax. No acute findings in the abdomen or pelvis. Fatty liver. Coronary artery disease. Electronically Signed   By: Franky Crease M.D.   On: 03/27/2024  19:18     Procedures   Medications Ordered in the ED  acetaminophen  (TYLENOL ) tablet 650 mg (650 mg Oral Given 03/27/24 1912)                                    Medical Decision Making Patient here for evaluation of injuries after mechanical fall.  Denies loss of consciousness.  He is experiencing left chest wall pain that is associated with deep breathing and movement denies any shortness of breath vomiting or abdominal pain.  No headache or dizziness.   He is also having some pain of the left wrist secondary to the fall there are no bony deformities of the wrist on exam  Suspect rib fracture.  Splenic injury, pneumothorax, wrist fracture/dislocation also considered.  Doubt head injury but subdural hematoma acute intracranial injury also considered   Amount and/or Complexity of Data Reviewed Labs: ordered.    Details: Labs patient mildly hyperglycemic with history of diabetes, his bicarb and ion gap are reassuring. Radiology: ordered.    Details: CT head and C-spine without acute intracranial or bony injury  X-ray left wrist without acute fracture or dislocation  CT chest abdomen and pelvis shows likely single rib fracture without evidence of pneumothorax Discussion of management or test interpretation with external provider(s): Patient with likely single rib fracture after mechanical fall.  No radiographic evidence of pneumothorax or acute intra-abdominal injury.  He is ambulatory with steady gait.  No hypoxia or increased work of breathing.  He appears appropriate for discharge home, will follow-up closely outpatient with his PCP.  Short course of pain medication prescribed and database was reviewed.  Patient given strict return precautions as well.  Risk OTC drugs. Prescription drug management.        Final diagnoses:  Fall, initial encounter  Closed fracture of one rib of left side, initial encounter    ED Discharge Orders          Ordered    HYDROcodone-acetaminophen  (NORCO/VICODIN) 5-325 MG tablet        03/27/24 2050               Herlinda Milling, PA-C 03/28/24 1446    Francesca Elsie CROME, MD 04/07/24 [phone]

## 2024-03-29 DIAGNOSIS — R059 Cough, unspecified: Secondary | ICD-10-CM | POA: Diagnosis not present

## 2024-03-29 DIAGNOSIS — J069 Acute upper respiratory infection, unspecified: Secondary | ICD-10-CM | POA: Diagnosis not present

## 2024-03-29 DIAGNOSIS — R0989 Other specified symptoms and signs involving the circulatory and respiratory systems: Secondary | ICD-10-CM | POA: Diagnosis not present

## 2024-03-29 DIAGNOSIS — S2232XA Fracture of one rib, left side, initial encounter for closed fracture: Secondary | ICD-10-CM | POA: Diagnosis not present

## 2024-03-29 DIAGNOSIS — Z6829 Body mass index (BMI) 29.0-29.9, adult: Secondary | ICD-10-CM | POA: Diagnosis not present

## 2024-05-20 DIAGNOSIS — K760 Fatty (change of) liver, not elsewhere classified: Secondary | ICD-10-CM | POA: Diagnosis not present

## 2024-05-20 DIAGNOSIS — E1122 Type 2 diabetes mellitus with diabetic chronic kidney disease: Secondary | ICD-10-CM | POA: Diagnosis not present

## 2024-05-20 DIAGNOSIS — R5383 Other fatigue: Secondary | ICD-10-CM | POA: Diagnosis not present

## 2024-05-20 DIAGNOSIS — Z1329 Encounter for screening for other suspected endocrine disorder: Secondary | ICD-10-CM | POA: Diagnosis not present

## 2024-05-27 DIAGNOSIS — R0602 Shortness of breath: Secondary | ICD-10-CM | POA: Diagnosis not present

## 2024-05-27 DIAGNOSIS — M48061 Spinal stenosis, lumbar region without neurogenic claudication: Secondary | ICD-10-CM | POA: Diagnosis not present

## 2024-05-27 DIAGNOSIS — N401 Enlarged prostate with lower urinary tract symptoms: Secondary | ICD-10-CM | POA: Diagnosis not present

## 2024-05-27 DIAGNOSIS — R6889 Other general symptoms and signs: Secondary | ICD-10-CM | POA: Diagnosis not present

## 2024-05-27 DIAGNOSIS — Z0001 Encounter for general adult medical examination with abnormal findings: Secondary | ICD-10-CM | POA: Diagnosis not present

## 2024-05-27 DIAGNOSIS — S2232XA Fracture of one rib, left side, initial encounter for closed fracture: Secondary | ICD-10-CM | POA: Diagnosis not present

## 2024-05-27 DIAGNOSIS — E1122 Type 2 diabetes mellitus with diabetic chronic kidney disease: Secondary | ICD-10-CM | POA: Diagnosis not present

## 2024-05-27 DIAGNOSIS — R351 Nocturia: Secondary | ICD-10-CM | POA: Diagnosis not present

## 2024-05-27 DIAGNOSIS — Z6829 Body mass index (BMI) 29.0-29.9, adult: Secondary | ICD-10-CM | POA: Diagnosis not present

## 2024-05-27 DIAGNOSIS — Z23 Encounter for immunization: Secondary | ICD-10-CM | POA: Diagnosis not present

## 2024-05-27 DIAGNOSIS — K760 Fatty (change of) liver, not elsewhere classified: Secondary | ICD-10-CM | POA: Diagnosis not present

## 2024-08-05 ENCOUNTER — Ambulatory Visit: Admitting: Urology

## 2024-08-05 ENCOUNTER — Encounter: Payer: Self-pay | Admitting: Urology

## 2024-08-05 VITALS — BP 87/58 | HR 97

## 2024-08-05 DIAGNOSIS — N138 Other obstructive and reflux uropathy: Secondary | ICD-10-CM

## 2024-08-05 DIAGNOSIS — R3915 Urgency of urination: Secondary | ICD-10-CM

## 2024-08-05 LAB — URINALYSIS, ROUTINE W REFLEX MICROSCOPIC
Bilirubin, UA: NEGATIVE
Leukocytes,UA: NEGATIVE
Nitrite, UA: NEGATIVE
Protein,UA: NEGATIVE
RBC, UA: NEGATIVE
Specific Gravity, UA: 1.02 (ref 1.005–1.030)
Urobilinogen, Ur: 0.2 mg/dL (ref 0.2–1.0)
pH, UA: 5.5 (ref 5.0–7.5)

## 2024-08-05 LAB — BLADDER SCAN AMB NON-IMAGING: Scan Result: 18

## 2024-08-05 MED ORDER — MIRABEGRON ER 25 MG PO TB24: 25.0000 mg | ORAL_TABLET | Freq: Every day | ORAL | 11 refills | Status: AC

## 2024-08-05 NOTE — Progress Notes (Unsigned)
 "  08/05/2024 10:56 AM   Jerry Dodson Pitch 12-22-1945 [id number]  Referring provider: Lari Elspeth BRAVO, MD [address], [address] [place],  [place] [address]  Urinary Urgency    HPI: Jerry Dodson is a 78yo here for evaluation of BPh with urinary urgency. IPSS 16 QOL 4 on flomax and avodart. Urine stream strong. He has bothersome urinary urgency and frequency for the past 6-8 months. He does no wear pads. No nocturnal enuresis.     PMH: Past Medical History:  Diagnosis Date   ACE-inhibitor cough    Anemia    was told blood didn't have enough iron in 1971   Arthritis    Diabetes mellitus without complication (HCC)    Family history of adverse reaction to anesthesia    Patient states sister was given too much anesthesia and now has nerve damage   Headache    History of kidney stones 2000   Hypertension    Pneumonia    x 2 at age 76 and in 36   PONV (postoperative nausea and vomiting)     Surgical History: Past Surgical History:  Procedure Laterality Date   BACK SURGERY  2004   x2   CARPAL TUNNEL RELEASE Bilateral 2009   COLONOSCOPY WITH PROPOFOL  N/A 01/28/2022   Procedure: COLONOSCOPY WITH PROPOFOL ;  Surgeon: Eartha Angelia Sieving, MD;  Location: [facility];  Service: Gastroenterology;  Laterality: N/A;  210 ASA 2, pt knows new arrival time per Anette Caldron   EYE SURGERY  20+ years ago   Lasik   HERNIA REPAIR  2018   TOTAL KNEE ARTHROPLASTY Left 05/07/2018   Procedure: LEFT TOTAL KNEE ARTHROPLASTY;  Surgeon: Kay Kemps, MD;  Location: [facility] OR;  Service: Orthopedics;  Laterality: Left;    Home Medications:  Allergies as of 08/05/2024       Reactions   Lisinopril  Cough        Medication List        Accurate as of August 05, 2024 10:56 AM. If you have any questions, ask your nurse or doctor.          atorvastatin  10 MG tablet Commonly known as: LIPITOR Take 10 mg by mouth every morning.   dutasteride 0.5 MG capsule Commonly known as: AVODART Take 0.5 mg  by mouth daily.   HYDROcodone -acetaminophen  5-325 MG tablet Commonly known as: NORCO/VICODIN Take one tab po q 4 hrs prn pain   ibuprofen 200 MG tablet Commonly known as: ADVIL Take 400 mg by mouth every 6 (six) hours as needed for moderate pain.   metFORMIN  500 MG tablet Commonly known as: GLUCOPHAGE  Take 500 mg by mouth 2 (two) times daily with a meal.   SYSTANE OP Place 1 drop into both eyes in the morning, at noon, in the evening, and at bedtime.   tamsulosin 0.4 MG Caps capsule Commonly known as: FLOMAX Take 0.4 mg by mouth daily.   valsartan  80 MG tablet Commonly known as: DIOVAN  TAKE 1 TABLET BY MOUTH DAILY        Allergies: Allergies[1]  Family History: No family history on file.  Social History:  reports that he has never smoked. He has never used smokeless tobacco. He reports current alcohol  use. He reports that he does not use drugs.  ROS: All other review of systems were reviewed and are negative except what is noted above in HPI  Physical Exam: BP (!) 87/58   Pulse 97   Constitutional:  Alert and oriented, No  acute distress. HEENT:  AT, moist mucus membranes.  Trachea midline, no masses. Cardiovascular: No clubbing, cyanosis, or edema. Respiratory: Normal respiratory effort, no increased work of breathing. GI: Abdomen is soft, nontender, nondistended, no abdominal masses GU: No CVA tenderness.  Lymph: No cervical or inguinal lymphadenopathy. Skin: No rashes, bruises or suspicious lesions. Neurologic: Grossly intact, no focal deficits, moving all 4 extremities. Psychiatric: Normal mood and affect.  Laboratory Data: Lab Results  Component Value Date   WBC 7.7 03/27/2024   HGB 13.2 03/27/2024   HCT 40.1 03/27/2024   MCV 90.1 03/27/2024   PLT 206 03/27/2024    Lab Results  Component Value Date   CREATININE 0.78 03/27/2024    No results found for: PSA  No results found for: TESTOSTERONE   Lab Results  Component Value Date   HGBA1C  6.3 (H) 04/26/2018    Urinalysis    Component Value Date/Time   COLORURINE YELLOW 03/27/2024 2006   APPEARANCEUR CLEAR 03/27/2024 2006   LABSPEC 1.026 03/27/2024 2006   PHURINE 5.0 03/27/2024 2006   GLUCOSEU >=500 (A) 03/27/2024 2006   HGBUR NEGATIVE 03/27/2024 2006   BILIRUBINUR NEGATIVE 03/27/2024 2006   KETONESUR NEGATIVE 03/27/2024 2006   PROTEINUR NEGATIVE 03/27/2024 2006   NITRITE NEGATIVE 03/27/2024 2006   LEUKOCYTESUR NEGATIVE 03/27/2024 2006    Lab Results  Component Value Date   BACTERIA NONE SEEN 03/27/2024    Pertinent Imaging: *** No results found for this or any previous visit.  No results found for this or any previous visit.  No results found for this or any previous visit.  No results found for this or any previous visit.  No results found for this or any previous visit.  No results found for this or any previous visit.  No results found for this or any previous visit.  No results found for this or any previous visit.   Assessment & Plan:    1. Benign prostatic hyperplasia with urinary obstruction (Primary) Continue flomax and avodart - Urinalysis, Routine w reflex microscopic - BLADDER SCAN AMB NON-IMAGING  2. Urinary urgency We will trial Mirabegron  25mg     No follow-ups on file.  Belvie Clara, MD  [facility] [place]      [1]  Allergies Allergen Reactions   Lisinopril  Cough   "

## 2024-08-05 NOTE — Progress Notes (Unsigned)
 Bladder Scan completed today due to reason BPH  Patient can void prior to the bladder scan. Bladder scan result: 18  Performed By: Exie DASEN. CMA  Additional notes- Patient is scheduled to follow up with MD

## 2024-08-05 NOTE — Patient Instructions (Signed)

## 2024-08-09 ENCOUNTER — Ambulatory Visit: Admitting: Urology

## 2024-11-09 ENCOUNTER — Ambulatory Visit: Admitting: Urology
